# Patient Record
Sex: Female | Born: 1970
Health system: Southern US, Community
[De-identification: ages and names within clinical notes are randomized; demographics above are authoritative.]

## PROBLEM LIST (undated history)

## (undated) DIAGNOSIS — D649 Anemia, unspecified: Secondary | ICD-10-CM

## (undated) DIAGNOSIS — J302 Other seasonal allergic rhinitis: Secondary | ICD-10-CM

## (undated) DIAGNOSIS — J45909 Unspecified asthma, uncomplicated: Secondary | ICD-10-CM

## (undated) HISTORY — DX: Unspecified asthma, uncomplicated: J45.909

## (undated) HISTORY — DX: Anemia, unspecified: D64.9

## (undated) HISTORY — DX: Other seasonal allergic rhinitis: J30.2

---

## 2001-02-26 ENCOUNTER — Ambulatory Visit (HOSPITAL_COMMUNITY): Admission: RE | Admit: 2001-02-26 | Discharge: 2001-02-26 | Payer: Self-pay | Admitting: Obstetrics & Gynecology

## 2001-04-24 ENCOUNTER — Inpatient Hospital Stay (HOSPITAL_COMMUNITY): Admission: AD | Admit: 2001-04-24 | Discharge: 2001-04-24 | Payer: Self-pay | Admitting: *Deleted

## 2001-04-26 ENCOUNTER — Inpatient Hospital Stay (HOSPITAL_COMMUNITY): Admission: AD | Admit: 2001-04-26 | Discharge: 2001-04-26 | Payer: Self-pay | Admitting: Obstetrics

## 2001-04-29 ENCOUNTER — Inpatient Hospital Stay (HOSPITAL_COMMUNITY): Admission: AD | Admit: 2001-04-29 | Discharge: 2001-05-12 | Payer: Self-pay | Admitting: Obstetrics

## 2001-05-01 ENCOUNTER — Encounter: Payer: Self-pay | Admitting: Obstetrics

## 2001-05-13 ENCOUNTER — Encounter: Admission: RE | Admit: 2001-05-13 | Discharge: 2001-06-12 | Payer: Self-pay | Admitting: Obstetrics

## 2001-07-13 ENCOUNTER — Encounter: Admission: RE | Admit: 2001-07-13 | Discharge: 2001-08-12 | Payer: Self-pay | Admitting: Obstetrics

## 2004-08-18 ENCOUNTER — Ambulatory Visit: Payer: Self-pay | Admitting: *Deleted

## 2004-08-23 ENCOUNTER — Ambulatory Visit: Payer: Self-pay | Admitting: Obstetrics and Gynecology

## 2004-08-25 ENCOUNTER — Ambulatory Visit: Payer: Self-pay | Admitting: Family Medicine

## 2004-08-30 ENCOUNTER — Ambulatory Visit (HOSPITAL_COMMUNITY): Admission: RE | Admit: 2004-08-30 | Discharge: 2004-08-30 | Payer: Self-pay | Admitting: *Deleted

## 2004-09-08 ENCOUNTER — Ambulatory Visit: Payer: Self-pay | Admitting: Family Medicine

## 2004-09-22 ENCOUNTER — Ambulatory Visit: Payer: Self-pay | Admitting: Family Medicine

## 2004-10-06 ENCOUNTER — Ambulatory Visit: Payer: Self-pay | Admitting: Family Medicine

## 2004-10-20 ENCOUNTER — Ambulatory Visit: Payer: Self-pay | Admitting: Family Medicine

## 2004-11-03 ENCOUNTER — Ambulatory Visit: Payer: Self-pay | Admitting: Family Medicine

## 2004-11-17 ENCOUNTER — Ambulatory Visit: Payer: Self-pay | Admitting: *Deleted

## 2004-11-24 ENCOUNTER — Ambulatory Visit: Payer: Self-pay | Admitting: Family Medicine

## 2004-12-08 ENCOUNTER — Ambulatory Visit: Payer: Self-pay | Admitting: Family Medicine

## 2004-12-15 ENCOUNTER — Ambulatory Visit: Payer: Self-pay | Admitting: Family Medicine

## 2004-12-22 ENCOUNTER — Ambulatory Visit (HOSPITAL_COMMUNITY): Admission: RE | Admit: 2004-12-22 | Discharge: 2004-12-22 | Payer: Self-pay | Admitting: *Deleted

## 2004-12-22 ENCOUNTER — Ambulatory Visit: Payer: Self-pay | Admitting: *Deleted

## 2004-12-29 ENCOUNTER — Ambulatory Visit: Payer: Self-pay | Admitting: Family Medicine

## 2005-01-05 ENCOUNTER — Ambulatory Visit: Payer: Self-pay | Admitting: Family Medicine

## 2005-01-12 ENCOUNTER — Ambulatory Visit: Payer: Self-pay | Admitting: Family Medicine

## 2005-01-19 ENCOUNTER — Ambulatory Visit: Payer: Self-pay | Admitting: Family Medicine

## 2005-01-26 ENCOUNTER — Ambulatory Visit: Payer: Self-pay | Admitting: Family Medicine

## 2005-01-30 ENCOUNTER — Ambulatory Visit: Payer: Self-pay | Admitting: Obstetrics and Gynecology

## 2005-02-01 ENCOUNTER — Inpatient Hospital Stay (HOSPITAL_COMMUNITY): Admission: AD | Admit: 2005-02-01 | Discharge: 2005-02-03 | Payer: Self-pay | Admitting: *Deleted

## 2005-02-01 ENCOUNTER — Ambulatory Visit: Payer: Self-pay | Admitting: Obstetrics and Gynecology

## 2012-05-30 ENCOUNTER — Other Ambulatory Visit: Payer: Self-pay | Admitting: *Deleted

## 2012-05-30 DIAGNOSIS — N644 Mastodynia: Secondary | ICD-10-CM

## 2012-06-17 ENCOUNTER — Encounter (HOSPITAL_COMMUNITY): Payer: Self-pay | Admitting: *Deleted

## 2012-06-25 ENCOUNTER — Encounter (HOSPITAL_COMMUNITY): Payer: Self-pay

## 2012-06-25 ENCOUNTER — Ambulatory Visit (HOSPITAL_COMMUNITY)
Admission: RE | Admit: 2012-06-25 | Discharge: 2012-06-25 | Disposition: A | Discharge status: Home / Self Care | Payer: Self-pay | Source: Ambulatory Visit | Attending: Obstetrics and Gynecology | Admitting: Obstetrics and Gynecology

## 2012-06-25 ENCOUNTER — Other Ambulatory Visit: Payer: Self-pay | Admitting: Obstetrics and Gynecology

## 2012-06-25 VITALS — BP 120/82 | Temp 98.5°F | Ht 67.5 in | Wt 164.2 lb

## 2012-06-25 DIAGNOSIS — Z01419 Encounter for gynecological examination (general) (routine) without abnormal findings: Secondary | ICD-10-CM

## 2012-06-25 DIAGNOSIS — N644 Mastodynia: Secondary | ICD-10-CM

## 2012-06-25 NOTE — Patient Instructions (Signed)
Taught patient how to perform BSE and gave educational materials to take home. Let her know [facility] will cover Pap smears every 3 years unless has a history of abnormal Pap smears. Patient referred to the [facility] for diagnostic mammogram and possible right breast ultrasound. Appointment scheduled for Wednesday, July 04, 2012 at 1030. Patient aware of appointment and will be there. Let patient know will follow up with her within the next couple weeks with results by letter or phone. Patient verbalized understanding.

## 2012-06-25 NOTE — Progress Notes (Signed)
Patient complained of right breast pain x 3 months that patient rates at a 7-8 out of 10.  Pap Smear:    Pap smear completed today. Per patient last Pap smear was January 2011 at [facility] and normal. Per patient she has no history of an abnormal Pap smear. No Pap smear results are in EPIC.  Physical exam: Breasts Breasts symmetrical. No skin abnormalities bilateral breasts. No nipple retraction bilateral breasts. No nipple discharge bilateral breasts. No lymphadenopathy. No lumps palpated bilateral breasts. Patient complained of right upper outer quadrant breast pain on exam. Patient referred to the [facility] for diagnostic mammogram and possible right breast ultrasound. Appointment scheduled for Wednesday, July 04, 2012 at 1030.          Pelvic/Bimanual   Ext Genitalia No lesions, no swelling and no discharge observed on external genitalia.         Vagina Vagina pink and normal texture. No lesions or discharge observed in vagina.          Cervix Cervix is present. Cervix red and friable. No discharge observed.     Uterus Uterus is present and palpable. Uterus in normal position and normal size.        Adnexae Bilateral ovaries present and palpable. No tenderness on palpation.          Rectovaginal No rectal exam completed today since patient had no rectal complaints. No skin abnormalities observed on exam.

## 2012-07-04 ENCOUNTER — Ambulatory Visit
Admission: RE | Admit: 2012-07-04 | Discharge: 2012-07-04 | Disposition: A | Discharge status: Home / Self Care | Payer: No Typology Code available for payment source | Source: Ambulatory Visit | Attending: Obstetrics and Gynecology | Admitting: Obstetrics and Gynecology

## 2012-07-04 DIAGNOSIS — N644 Mastodynia: Secondary | ICD-10-CM

## 2012-07-29 ENCOUNTER — Telehealth (HOSPITAL_COMMUNITY): Payer: Self-pay | Admitting: *Deleted

## 2012-07-29 NOTE — Telephone Encounter (Signed)
Telephoned patient at mobile # and discussed results of negative pap smear. Next pap smear due in 3 years. Patient voiced understanding.

## 2012-11-04 ENCOUNTER — Encounter (HOSPITAL_COMMUNITY): Payer: Self-pay | Admitting: Emergency Medicine

## 2012-11-04 ENCOUNTER — Emergency Department (HOSPITAL_COMMUNITY)
Admission: EM | Admit: 2012-11-04 | Discharge: 2012-11-04 | Disposition: A | Discharge status: Home / Self Care | Payer: BC Managed Care – PPO | Source: Home / Self Care | Attending: Emergency Medicine | Admitting: Emergency Medicine

## 2012-11-04 DIAGNOSIS — J302 Other seasonal allergic rhinitis: Secondary | ICD-10-CM

## 2012-11-04 DIAGNOSIS — J309 Allergic rhinitis, unspecified: Secondary | ICD-10-CM

## 2012-11-04 DIAGNOSIS — J029 Acute pharyngitis, unspecified: Secondary | ICD-10-CM

## 2012-11-04 DIAGNOSIS — J310 Chronic rhinitis: Secondary | ICD-10-CM

## 2012-11-04 MED ORDER — FLUTICASONE PROPIONATE 50 MCG/ACT NA SUSP: 2.0000 | Freq: Every day | NASAL | Status: DC

## 2012-11-04 MED ORDER — FEXOFENADINE-PSEUDOEPHED ER 60-120 MG PO TB12: 1.0000 | ORAL_TABLET | Freq: Two times a day (BID) | ORAL | Status: DC

## 2012-11-04 NOTE — ED Notes (Signed)
Physician e-scribed scripts to the wrong pharmacy.  Notified physician.  Called pharmacy of patient preference and scripts were transferred to location preferred by patient.

## 2012-11-04 NOTE — ED Provider Notes (Signed)
History     CSN: [id number]  Arrival date & time 11/04/12  1144   First MD Initiated Contact with Patient 11/04/12 1309      No chief complaint on file.   (Consider location/radiation/quality/duration/timing/severity/associated sxs/prior treatment) HPI Comments: Patient presents urgent care describing that for several days almost a week she's been having several symptoms such as a runny and congested nose. A sore throat some body aches and feeling cold at times. Itchiness and irritation and tearing of both of her eyes. Occasional sneezing. No cough or shortness of breath or wheezing. She does however have some body aches and feels somewhat fatigue. Feels sometimes that she "chokes", it was she's able to bring up phlegm, he gets stuck or adhere to her throat. She has taken some over-the-counter cetirizine, a couple days ago but it did not help  Patient is a 42 y.o. female presenting with pharyngitis.  Sore Throat This is a new problem. The current episode started more than 2 days ago. The problem occurs constantly. The problem has been gradually worsening. Pertinent negatives include no abdominal pain and no shortness of breath. The symptoms are aggravated by swallowing. Nothing relieves the symptoms.    No past medical history on file.  No past surgical history on file.  Family History  Problem Relation Age of Onset  . Cancer Mother     uterine    History  Substance Use Topics  . Smoking status: Never Smoker   . Smokeless tobacco: Not on file  . Alcohol Use: No    OB History   Grav Para Term Preterm Abortions TAB SAB Ect Mult Living   2 2 2       2       Review of Systems  Constitutional: Negative for chills, diaphoresis, activity change, appetite change and unexpected weight change.  HENT: Positive for congestion, sore throat, rhinorrhea and sinus pressure. Negative for ear pain, neck pain and dental problem.   Eyes: Positive for redness and itching. Negative for  photophobia, pain, discharge and visual disturbance.  Respiratory: Negative for cough, shortness of breath and wheezing.   Gastrointestinal: Negative for abdominal pain.  Musculoskeletal: Positive for myalgias and arthralgias. Negative for back pain, joint swelling and gait problem.  Skin: Negative for color change.  Allergic/Immunologic: Positive for environmental allergies. Negative for immunocompromised state.    Allergies  Review of patient's allergies indicates no known allergies.  Home Medications   Current Outpatient Rx  Name  Route  Sig  Dispense  Refill  . fexofenadine-pseudoephedrine (ALLEGRA-D) 60-120 MG per tablet   Oral   Take 1 tablet by mouth every 12 (twelve) hours.   30 tablet   0   . fluticasone (FLONASE) 50 MCG/ACT nasal spray   Nasal   Place 2 sprays into the nose daily.   16 g   2     BP 118/77  Pulse 77  Temp(Src) 98.3 F (36.8 C) (Oral)  Resp 16  SpO2 100%  Physical Exam  Nursing note and vitals reviewed. Constitutional: She is oriented to person, place, and time. Vital signs are normal. She appears well-developed and well-nourished.  Non-toxic appearance. She does not have a sickly appearance. She does not appear ill. No distress.  HENT:  Head: Normocephalic.  Right Ear: Tympanic membrane normal.  Left Ear: Tympanic membrane normal.  Nose: Rhinorrhea present. No nasal septal hematoma. No epistaxis.  No foreign bodies.  Mouth/Throat: Uvula is midline and mucous membranes are normal. No oropharyngeal exudate, posterior  oropharyngeal edema or tonsillar abscesses.  Eyes: Conjunctivae are normal. Pupils are equal, round, and reactive to light. Right eye exhibits no discharge. Left eye exhibits no discharge. No scleral icterus.  Neck: Neck supple. No JVD present.  Cardiovascular: Normal rate.  Exam reveals no gallop and no friction rub.   No murmur heard. Pulmonary/Chest: Effort normal and breath sounds normal.  Lymphadenopathy:    She has no  cervical adenopathy.  Neurological: She is alert and oriented to person, place, and time.  Skin: No rash noted. No erythema.    ED Course  Procedures (including critical care time)  Labs Reviewed - No data to display No results found.   1. Seasonal allergies   2. Pharyngitis   3. Rhinitis       MDM  Current symptoms and exam are most consistent with seasonal allergies induced symptoms. Will start patient with a two-week course of an antihistamine with a decongestion as well as Flonase. Patient is afebrile the most comfortable in no respiratory distress and no significant findings on her physical exam.      Jimmie Molly, MD 11/04/12 1349

## 2013-05-25 ENCOUNTER — Encounter (HOSPITAL_COMMUNITY): Payer: Self-pay | Admitting: Emergency Medicine

## 2013-05-25 ENCOUNTER — Emergency Department (INDEPENDENT_AMBULATORY_CARE_PROVIDER_SITE_OTHER)
Admission: EM | Admit: 2013-05-25 | Discharge: 2013-05-25 | Disposition: A | Discharge status: Home / Self Care | Payer: BC Managed Care – PPO | Source: Home / Self Care | Attending: Family Medicine | Admitting: Family Medicine

## 2013-05-25 DIAGNOSIS — R1033 Periumbilical pain: Secondary | ICD-10-CM

## 2013-05-25 DIAGNOSIS — R52 Pain, unspecified: Secondary | ICD-10-CM

## 2013-05-25 NOTE — ED Provider Notes (Addendum)
CSN: [id number]     Arrival date & time 05/25/13  1708 History   First MD Initiated Contact with Patient 05/25/13 1711     Chief Complaint  Patient presents with  . Abdominal Pain   (Consider location/radiation/quality/duration/timing/severity/associated sxs/prior Treatment) Patient is a 42 y.o. female presenting with abdominal pain. The history is provided by the patient.  Abdominal Pain This is a new problem. The current episode started yesterday. The problem has been gradually improving. Associated symptoms include abdominal pain. Pertinent negatives include no chest pain and no headaches.    History reviewed. No pertinent past medical history. History reviewed. No pertinent past surgical history. Family History  Problem Relation Age of Onset  . Cancer Mother     uterine   History  Substance Use Topics  . Smoking status: Never Smoker   . Smokeless tobacco: Not on file  . Alcohol Use: No   OB History   Grav Para Term Preterm Abortions TAB SAB Ect Mult Living   2 2 2       2      Review of Systems  Constitutional: Negative.   Cardiovascular: Negative for chest pain.  Gastrointestinal: Positive for abdominal pain. Negative for nausea, vomiting, diarrhea, constipation and abdominal distention.  Genitourinary: Negative.   Neurological: Negative for headaches.    Allergies  Review of patient's allergies indicates no known allergies.  Home Medications   Current Outpatient Rx  Name  Route  Sig  Dispense  Refill  . cetirizine (ZYRTEC) 10 MG tablet   Oral   Take 10 mg by mouth daily.         . fexofenadine-pseudoephedrine (ALLEGRA-D) 60-120 MG per tablet   Oral   Take 1 tablet by mouth every 12 (twelve) hours.   30 tablet   0   . fluticasone (FLONASE) 50 MCG/ACT nasal spray   Nasal   Place 2 sprays into the nose daily.   16 g   2   . Naproxen Sodium (ALEVE PO)   Oral   Take by mouth.          BP 119/78  Pulse 90  Temp(Src) 98 F (36.7 C) (Oral)   Resp 16  SpO2 98%  LMP 05/11/2013 Physical Exam  Nursing note and vitals reviewed. Constitutional: She appears well-developed and well-nourished. No distress.  Abdominal: Soft. Bowel sounds are normal. She exhibits no distension and no mass. There is no hepatosplenomegaly. There is tenderness in the periumbilical area. There is no rigidity, no rebound, no guarding, no CVA tenderness, no tenderness at McBurney's point and negative Murphy's sign. A hernia is present. Hernia confirmed positive in the ventral area.    Skin: Skin is warm and dry.    ED Course  Procedures (including critical care time) Labs Review Labs Reviewed - No data to display Imaging Review No results found.  EKG Interpretation     Ventricular Rate:    PR Interval:    QRS Duration:   QT Interval:    QTC Calculation:   R Axis:     Text Interpretation:              MDM      Linna Hoff, MD 05/25/13 1742  Linna Hoff, MD 05/25/13 [phone]

## 2013-05-25 NOTE — ED Notes (Signed)
Pt c/o abdominal pain x 1 day. Was told she has a hernia and never had any surgical intervention. Felt mild cramping and was doubled over in pain. Pt als o c/o HA on right side. HA subsided after taking advil but returned this morning. Pt is alert and oriented and in no acute distress.

## 2013-05-28 ENCOUNTER — Encounter (HOSPITAL_COMMUNITY): Payer: Self-pay | Admitting: Emergency Medicine

## 2013-05-28 DIAGNOSIS — D509 Iron deficiency anemia, unspecified: Secondary | ICD-10-CM | POA: Insufficient documentation

## 2013-05-28 DIAGNOSIS — K429 Umbilical hernia without obstruction or gangrene: Secondary | ICD-10-CM | POA: Insufficient documentation

## 2013-05-28 DIAGNOSIS — R1013 Epigastric pain: Secondary | ICD-10-CM | POA: Insufficient documentation

## 2013-05-28 DIAGNOSIS — IMO0002 Reserved for concepts with insufficient information to code with codable children: Secondary | ICD-10-CM | POA: Insufficient documentation

## 2013-05-28 DIAGNOSIS — Z79899 Other long term (current) drug therapy: Secondary | ICD-10-CM | POA: Insufficient documentation

## 2013-05-28 DIAGNOSIS — Z3202 Encounter for pregnancy test, result negative: Secondary | ICD-10-CM | POA: Insufficient documentation

## 2013-05-28 LAB — COMPREHENSIVE METABOLIC PANEL
ALT: 9 U/L (ref 0–35)
Albumin: 3.7 g/dL (ref 3.5–5.2)
Alkaline Phosphatase: 71 U/L (ref 39–117)
BUN: 13 mg/dL (ref 6–23)
Chloride: 103 mEq/L (ref 96–112)
Potassium: 3.7 mEq/L (ref 3.5–5.1)
Sodium: 136 mEq/L (ref 135–145)
Total Bilirubin: 0.3 mg/dL (ref 0.3–1.2)

## 2013-05-28 LAB — CBC WITH DIFFERENTIAL/PLATELET
Eosinophils Relative: 3 % (ref 0–5)
HCT: 27.8 % — ABNORMAL LOW (ref 36.0–46.0)
Lymphocytes Relative: 38 % (ref 12–46)
Lymphs Abs: 2.8 10*3/uL (ref 0.7–4.0)
MCV: 61.4 fL — ABNORMAL LOW (ref 78.0–100.0)
Monocytes Relative: 12 % (ref 3–12)
Neutro Abs: 3.4 10*3/uL (ref 1.7–7.7)
Platelets: 208 10*3/uL (ref 150–400)
RBC: 4.53 MIL/uL (ref 3.87–5.11)
WBC: 7.4 10*3/uL (ref 4.0–10.5)

## 2013-05-28 LAB — POCT I-STAT TROPONIN I

## 2013-05-28 LAB — LIPASE, BLOOD: Lipase: 61 U/L — ABNORMAL HIGH (ref 11–59)

## 2013-05-28 NOTE — ED Notes (Signed)
Pt. reports epigastric pain onset this evening , denies nausea/vomitting or diarrhea , no SOB .

## 2013-05-29 ENCOUNTER — Emergency Department (HOSPITAL_COMMUNITY)
Admission: EM | Admit: 2013-05-29 | Discharge: 2013-05-29 | Disposition: A | Discharge status: Home / Self Care | Payer: BC Managed Care – PPO | Attending: Emergency Medicine | Admitting: Emergency Medicine

## 2013-05-29 DIAGNOSIS — R1013 Epigastric pain: Secondary | ICD-10-CM

## 2013-05-29 DIAGNOSIS — K429 Umbilical hernia without obstruction or gangrene: Secondary | ICD-10-CM

## 2013-05-29 DIAGNOSIS — D509 Iron deficiency anemia, unspecified: Secondary | ICD-10-CM

## 2013-05-29 LAB — URINALYSIS, ROUTINE W REFLEX MICROSCOPIC
Bilirubin Urine: NEGATIVE
Glucose, UA: NEGATIVE mg/dL
Hgb urine dipstick: NEGATIVE
Ketones, ur: NEGATIVE mg/dL
Nitrite: NEGATIVE
Specific Gravity, Urine: 1.014 (ref 1.005–1.030)
pH: 6.5 (ref 5.0–8.0)

## 2013-05-29 LAB — POCT PREGNANCY, URINE: Preg Test, Ur: NEGATIVE

## 2013-05-29 MED ORDER — GI COCKTAIL ~~LOC~~
30.0000 mL | Freq: Once | ORAL | Status: AC
  Administered 2013-05-29: 30 mL via ORAL
  Filled 2013-05-29: qty 30

## 2013-05-29 MED ORDER — PANTOPRAZOLE SODIUM 40 MG PO TBEC
40.0000 mg | DELAYED_RELEASE_TABLET | Freq: Once | ORAL | Status: AC
  Administered 2013-05-29: 40 mg via ORAL
  Filled 2013-05-29: qty 1

## 2013-05-29 MED ORDER — FERROUS SULFATE 325 (65 FE) MG PO TABS: 325.0000 mg | ORAL_TABLET | Freq: Two times a day (BID) | ORAL | Status: DC

## 2013-05-29 MED ORDER — PANTOPRAZOLE SODIUM 40 MG PO TBEC: 40.0000 mg | DELAYED_RELEASE_TABLET | Freq: Every day | ORAL | Status: DC

## 2013-05-29 NOTE — ED Notes (Signed)
Pt c/o pain around naval, Pt was at urgent care on Sunday, 9/10

## 2013-05-29 NOTE — ED Provider Notes (Signed)
CSN: [id number]     Arrival date & time 05/28/13  2227 History   First MD Initiated Contact with Patient 05/29/13 0026     Chief Complaint  Patient presents with  . Chest Pain   (Consider location/radiation/quality/duration/timing/severity/associated sxs/prior Treatment) Patient is a 42 y.o. female presenting with chest pain. The history is provided by the patient.  Chest Pain She had onset this evening of epigastric pain. Pain is sharp and waxes and wanes. It is rated at 8/10 at its worst. There is no associated nausea or vomiting and no constipation or diarrhea. She states that she occasionally uses naproxen but estimates that she only uses it once a month. No other NSAID or aspirin use. Denies ethanol use. She had been seen in urgent care several days ago for an umbilical hernia but states that this pain is different. It is worse with palpation but it is not affected by eating. She tried drinking some tea which did not give her any relief.  History reviewed. No pertinent past medical history. History reviewed. No pertinent past surgical history. Family History  Problem Relation Age of Onset  . Cancer Mother     uterine   History  Substance Use Topics  . Smoking status: Never Smoker   . Smokeless tobacco: Not on file  . Alcohol Use: No   OB History   Grav Para Term Preterm Abortions TAB SAB Ect Mult Living   2 2 2       2      Review of Systems  Cardiovascular: Positive for chest pain.  All other systems reviewed and are negative.    Allergies  Review of patient's allergies indicates no known allergies.  Home Medications   Current Outpatient Rx  Name  Route  Sig  Dispense  Refill  . cetirizine (ZYRTEC) 10 MG tablet   Oral   Take 10 mg by mouth daily.         . fexofenadine-pseudoephedrine (ALLEGRA-D) 60-120 MG per tablet   Oral   Take 1 tablet by mouth every 12 (twelve) hours.   30 tablet   0   . fluticasone (FLONASE) 50 MCG/ACT nasal spray   Nasal   Place 2  sprays into the nose daily.   16 g   2   . Naproxen Sodium (ALEVE PO)   Oral   Take by mouth.          BP 133/92  Pulse 97  Temp(Src) 98.3 F (36.8 C) (Oral)  Resp 18  SpO2 98%  LMP 05/11/2013 Physical Exam  Nursing note and vitals reviewed.  42 year old female, resting comfortably and in no acute distress. Vital signs are significant for borderline hypertension with blood pressure 133/92. Oxygen saturation is 98%, which is normal. Head is normocephalic and atraumatic. PERRLA, EOMI. Oropharynx is clear. Neck is nontender and supple without adenopathy or JVD. Back is nontender and there is no CVA tenderness. Lungs are clear without rales, wheezes, or rhonchi. Chest is nontender. Heart has regular rate and rhythm without murmur. Abdomen is soft, flat, with mild epigastric tenderness. There is no rebound or guarding. Umbilical hernia is present which is easily reducible and nontender. There are no masses or hepatosplenomegaly and peristalsis is normoactive. Extremities have no cyanosis or edema, full range of motion is present. Skin is warm and dry without rash. Neurologic: Mental status is normal, cranial nerves are intact, there are no motor or sensory deficits.  ED Course  Procedures (including critical care time) Labs  Review Results for orders placed during the hospital encounter of 05/29/13  CBC WITH DIFFERENTIAL      Result Value Range   WBC 7.4  4.0 - 10.5 K/uL   RBC 4.53  3.87 - 5.11 MIL/uL   Hemoglobin 8.3 (*) 12.0 - 15.0 g/dL   HCT 16.1 (*) 09.6 - 04.5 %   MCV 61.4 (*) 78.0 - 100.0 fL   MCH 18.3 (*) 26.0 - 34.0 pg   MCHC 29.9 (*) 30.0 - 36.0 g/dL   RDW 40.9 (*) 81.1 - 91.4 %   Platelets 208  150 - 400 K/uL   Neutrophils Relative % 46  43 - 77 %   Lymphocytes Relative 38  12 - 46 %   Monocytes Relative 12  3 - 12 %   Eosinophils Relative 3  0 - 5 %   Basophils Relative 1  0 - 1 %   Neutro Abs 3.4  1.7 - 7.7 K/uL   Lymphs Abs 2.8  0.7 - 4.0 K/uL   Monocytes  Absolute 0.9  0.1 - 1.0 K/uL   Eosinophils Absolute 0.2  0.0 - 0.7 K/uL   Basophils Absolute 0.1  0.0 - 0.1 K/uL   RBC Morphology SPHEROCYTES     WBC Morphology ATYPICAL LYMPHOCYTES    COMPREHENSIVE METABOLIC PANEL      Result Value Range   Sodium 136  135 - 145 mEq/L   Potassium 3.7  3.5 - 5.1 mEq/L   Chloride 103  96 - 112 mEq/L   CO2 22  19 - 32 mEq/L   Glucose, Bld 114 (*) 70 - 99 mg/dL   BUN 13  6 - 23 mg/dL   Creatinine, Ser 7.82  0.50 - 1.10 mg/dL   Calcium 9.5  8.4 - 95.6 mg/dL   Total Protein 7.6  6.0 - 8.3 g/dL   Albumin 3.7  3.5 - 5.2 g/dL   AST 18  0 - 37 U/L   ALT 9  0 - 35 U/L   Alkaline Phosphatase 71  39 - 117 U/L   Total Bilirubin 0.3  0.3 - 1.2 mg/dL   GFR calc non Af Amer >90  >90 mL/min   GFR calc Af Amer >90  >90 mL/min  LIPASE, BLOOD      Result Value Range   Lipase 61 (*) 11 - 59 U/L  URINALYSIS, ROUTINE W REFLEX MICROSCOPIC      Result Value Range   Color, Urine YELLOW  YELLOW   APPearance CLEAR  CLEAR   Specific Gravity, Urine 1.014  1.005 - 1.030   pH 6.5  5.0 - 8.0   Glucose, UA NEGATIVE  NEGATIVE mg/dL   Hgb urine dipstick NEGATIVE  NEGATIVE   Bilirubin Urine NEGATIVE  NEGATIVE   Ketones, ur NEGATIVE  NEGATIVE mg/dL   Protein, ur NEGATIVE  NEGATIVE mg/dL   Urobilinogen, UA 0.2  0.0 - 1.0 mg/dL   Nitrite NEGATIVE  NEGATIVE   Leukocytes, UA NEGATIVE  NEGATIVE  POCT I-STAT TROPONIN I      Result Value Range   Troponin i, poc 0.00  0.00 - 0.08 ng/mL   Comment 3           POCT PREGNANCY, URINE      Result Value Range   Preg Test, Ur NEGATIVE  NEGATIVE   MDM   1. Epigastric pain   2. Microcytic anemia   3. Umbilical hernia    Epigastric pain which seems most consistent with gastritis. She'll be  given a therapeutic trial of a GI cocktail. Her laboratory workup had been completed and she has a borderline elevated lipase which is not felt to be clinically significant. Significant anemia is present which is microcytic with elevated RDW  suggesting iron deficiency. Old records are reviewed and she was seen several days ago for umbilical hernia.  She feels much better after GI cocktail. She'll be treated for possible peptic ulcer disease, possible gastritis with pantoprazole. She is given a prescription for ferrous sulfate for her anemia. Of note, she does state that she has very heavy menses which is the most likely source of blood loss.  Dione Booze, MD 05/29/13 [phone]

## 2013-06-16 ENCOUNTER — Other Ambulatory Visit: Payer: Self-pay

## 2013-07-25 ENCOUNTER — Encounter: Payer: Self-pay | Admitting: Women's Health

## 2013-07-25 ENCOUNTER — Ambulatory Visit (INDEPENDENT_AMBULATORY_CARE_PROVIDER_SITE_OTHER): Payer: BC Managed Care – PPO | Admitting: Women's Health

## 2013-07-25 VITALS — BP 109/72 | Ht 68.0 in | Wt 156.2 lb

## 2013-07-25 DIAGNOSIS — Z01419 Encounter for gynecological examination (general) (routine) without abnormal findings: Secondary | ICD-10-CM

## 2013-07-25 DIAGNOSIS — Z833 Family history of diabetes mellitus: Secondary | ICD-10-CM

## 2013-07-25 LAB — CBC WITH DIFFERENTIAL/PLATELET
BASOS ABS: 0 10*3/uL (ref 0.0–0.1)
Basophils Relative: 1 % (ref 0–1)
EOS ABS: 0.1 10*3/uL (ref 0.0–0.7)
EOS PCT: 1 % (ref 0–5)
HCT: 38.5 % (ref 36.0–46.0)
Hemoglobin: 12.1 g/dL (ref 12.0–15.0)
LYMPHS ABS: 2.2 10*3/uL (ref 0.7–4.0)
Lymphocytes Relative: 37 % (ref 12–46)
MCH: 22.3 pg — AB (ref 26.0–34.0)
MCHC: 31.4 g/dL (ref 30.0–36.0)
MCV: 70.9 fL — AB (ref 78.0–100.0)
Monocytes Absolute: 0.7 10*3/uL (ref 0.1–1.0)
Monocytes Relative: 13 % — ABNORMAL HIGH (ref 3–12)
Neutro Abs: 2.8 10*3/uL (ref 1.7–7.7)
Neutrophils Relative %: 48 % (ref 43–77)
PLATELETS: 234 10*3/uL (ref 150–400)
RBC: 5.43 MIL/uL — ABNORMAL HIGH (ref 3.87–5.11)
RDW: 20.9 % — AB (ref 11.5–15.5)
WBC: 5.8 10*3/uL (ref 4.0–10.5)

## 2013-07-25 LAB — HEMOGLOBIN A1C
HEMOGLOBIN A1C: 5.2 % (ref <5.7)
Mean Plasma Glucose: 103 mg/dL (ref <117)

## 2013-07-25 NOTE — Progress Notes (Signed)
Brenda Reed Aug 18, 1970 [id number]    History:    Presents for annual exam.  monthy cycle using no contraception, pregnancy ok.  Normal pap and mammogram history.  Mastodynia.   Past medical history, past surgical history, family history and social history were all reviewed and documented in the EPIC chart. From [place].  Works at [employer], sons 53 and 51. Mother uterine cancer.  ROS:  A  ROS was performed and pertinent positives and negatives are included.  Exam:  Filed Vitals:   07/25/13 1028  BP: 109/72    General appearance:  Normal Thyroid:  Symmetrical, normal in size, without palpable masses or nodularity. Respiratory  Auscultation:  Clear without wheezing or rhonchi Cardiovascular  Auscultation:  Regular rate, without rubs, murmurs or gallops  Edema/varicosities:  Not grossly evident Abdominal  Soft,nontender, without masses, guarding or rebound.  Liver/spleen:  No organomegaly noted  Hernia:  None appreciated  Skin  Inspection:  Grossly normal   Breasts: Examined lying and sitting.     Right: Without masses, retractions, discharge or axillary adenopathy.     Left: Without masses, retractions, discharge or axillary adenopathy. Gentitourinary   Inguinal/mons:  Normal without inguinal adenopathy  External genitalia:  Normal  BUS/Urethra/Skene's glands:  Normal  Vagina:  Normal  Cervix:  Normal  Uterus:   normal in size, shape and contour.  Midline and mobile  Adnexa/parametria:     Rt: Without masses or tenderness.   Lt: Without masses or tenderness.  Anus and perineum: Normal  Digital rectal exam: Normal sphincter tone without palpated masses or tenderness  Assessment/Plan:  43 y.o. MBF G2P2 for annual exam with no complaints.  Normal Gyn exam/declines contraception Mastodynia  Plan: SBE's.  annual mammogram 3D tomography reviewed and encouraged, history of dense breast. Pap normal 2013. New screening guidelines reviewed. Exercise, MVI daily, Vit  E, calcium rich diet encouraged. CBC, hgb A1c, UA. Return to office with missed cycle.   [name] [facility], 11:49 AM 07/25/2013

## 2013-07-25 NOTE — Patient Instructions (Signed)

## 2013-07-26 LAB — URINALYSIS W MICROSCOPIC + REFLEX CULTURE
BACTERIA UA: NONE SEEN
Bilirubin Urine: NEGATIVE
CASTS: NONE SEEN
Crystals: NONE SEEN
Glucose, UA: NEGATIVE mg/dL
HGB URINE DIPSTICK: NEGATIVE
KETONES UR: NEGATIVE mg/dL
Leukocytes, UA: NEGATIVE
NITRITE: NEGATIVE
PH: 5.5 (ref 5.0–8.0)
Protein, ur: NEGATIVE mg/dL
Specific Gravity, Urine: 1.029 (ref 1.005–1.030)
Urobilinogen, UA: 0.2 mg/dL (ref 0.0–1.0)

## 2013-07-28 ENCOUNTER — Other Ambulatory Visit: Payer: Self-pay

## 2013-07-28 DIAGNOSIS — Z1231 Encounter for screening mammogram for malignant neoplasm of breast: Secondary | ICD-10-CM

## 2013-08-19 ENCOUNTER — Ambulatory Visit
Admission: RE | Admit: 2013-08-19 | Discharge: 2013-08-19 | Disposition: A | Discharge status: Home / Self Care | Payer: BC Managed Care – PPO | Source: Ambulatory Visit

## 2013-08-19 DIAGNOSIS — Z1231 Encounter for screening mammogram for malignant neoplasm of breast: Secondary | ICD-10-CM

## 2014-05-11 ENCOUNTER — Encounter: Payer: Self-pay | Admitting: Women's Health

## 2014-06-08 ENCOUNTER — Ambulatory Visit (INDEPENDENT_AMBULATORY_CARE_PROVIDER_SITE_OTHER): Payer: BC Managed Care – PPO | Admitting: Internal Medicine

## 2014-06-08 ENCOUNTER — Encounter: Payer: Self-pay | Admitting: Internal Medicine

## 2014-06-08 VITALS — BP 108/80 | HR 80 | Temp 98.9°F | Wt 162.2 lb

## 2014-06-08 DIAGNOSIS — R7989 Other specified abnormal findings of blood chemistry: Secondary | ICD-10-CM

## 2014-06-08 DIAGNOSIS — R946 Abnormal results of thyroid function studies: Secondary | ICD-10-CM

## 2014-06-08 LAB — TSH: TSH: 0.02 u[IU]/mL — ABNORMAL LOW (ref 0.35–4.50)

## 2014-06-08 LAB — T3, FREE: T3, Free: 4.2 pg/mL (ref 2.3–4.2)

## 2014-06-08 LAB — T4, FREE: FREE T4: 1.37 ng/dL (ref 0.60–1.60)

## 2014-06-08 NOTE — Progress Notes (Signed)
Patient ID: Brenda Reed, female   DOB: 13-Oct-1970, 43 y.o.   MRN: [id number]   HPI  Brenda Reed is a 43 y.o.-year-old female, referred by her PCP, Dr. Yaakov Guthrie, in consultation for a low TSH.  I reviewed pt's thyroid tests: 04/25/2014: TSH 0.009, total T4 11.5 (4.5-12) No previous checks.  Pt denies feeling nodules in neck, hoarseness, dysphagia/odynophagia, SOB with lying down; she c/o: - no excessive sweating/heat intolerance, just face feeling hotter - + fatigue - no tremors - no anxiety - no palpitations - no problems with concentration - no hyperdefecation - no weight loss  She has regular menses.  ? FH of thyroid ds. No FH of thyroid cancer. No h/o radiation tx to head or neck.  No seaweed or kelp, no recent contrast studies. No steroid use. No herbal supplements.   ROS: Constitutional: see HPI Eyes: no blurry vision, no xerophthalmia ENT: no sore throat, no nodules palpated in throat, no dysphagia/odynophagia, no hoarseness Cardiovascular: no CP/SOB/palpitations/leg swelling Respiratory: no cough/SOB Gastrointestinal: no N/V/D/+ C/+ acid reflux, + bloating Musculoskeletal: + muscle aches/no joint aches Skin: no rashes Neurological: no tremors/numbness/tingling/dizziness Psychiatric: no depression/anxiety Low libido   Past Medical History  Diagnosis Date  . Anemia    No past surgical history on file. History   Social History  . Marital Status: Married    Spouse Name: N/A    Number of Children: 2   Occupational History  . n/a   Social History Main Topics  . Smoking status: Never Smoker   . Smokeless tobacco: No  . Alcohol Use: No  . Drug Use: No   Current Outpatient Prescriptions on File Prior to Visit  Medication Sig Dispense Refill  . ferrous sulfate 325 (65 FE) MG tablet Take 1 tablet (325 mg total) by mouth 2 (two) times daily with a meal. 60 tablet 0  . pantoprazole (PROTONIX) 40 MG tablet Take 1 tablet (40 mg  total) by mouth daily. 30 tablet 0   No current facility-administered medications on file prior to visit.   No Known Allergies Family History  Problem Relation Age of Onset  . Cancer Mother     uterine   PE: BP 108/80 mmHg  Pulse 80  Temp(Src) 98.9 F (37.2 C) (Oral)  Wt 162 lb 3.2 oz (73.573 kg)  SpO2 97% Wt Readings from Last 3 Encounters:  06/08/14 162 lb 3.2 oz (73.573 kg)  07/25/13 156 lb 3.2 oz (70.852 kg)  06/25/12 164 lb 3.2 oz (74.481 kg)   Constitutional: overweight, in NAD Eyes: PERRLA, EOMI, no exophthalmos, no lid lag, no stare ENT: moist mucous membranes, no thyromegaly, no thyroid bruits, no cervical lymphadenopathy Cardiovascular: RRR, No MRG Respiratory: CTA B Gastrointestinal: abdomen soft, NT, ND, BS+ Musculoskeletal: no deformities, strength intact in all 4 Skin: moist, warm, no rashes Neurological: no tremor with outstretched hands, DTR normal in all 4  ASSESSMENT: 1. Low TSH  PLAN:  1. Patient with a recently found low TSH, without thyrotoxic sxs - she does not appear to have exogenous causes for the low TSH.  - We discussed that possible causes of thyrotoxicosis are:  Graves ds   Thyroiditis toxic multinodular goiter/ toxic adenoma (I cannot feel nodules at palpation of her thyroid). - I suggested that we check the TSH, fT3 and fT4 and also had thyroid stimulating antibodies to screen for Graves' disease.  - If the tests remain abnormal, I may need to obtain an uptake and scan to differentiate between the  3 above possible etiologies  - I did advice her that we might need to do thyroid ultrasound depending on the results of the uptake and scan (if a cold nodule is present) - I do not feel that we need to add beta blockers at this time, since she is not tachycardic, anxious, or tremulous - RTC in 4 months, but likely sooner for repeat labs  Component     Latest Ref Rng 06/08/2014  TSH     0.35 - 4.50 uIU/mL 0.02 (L)  Free T4     0.60 - 1.60  ng/dL 1.37  T3, Free     2.3 - 4.2 pg/mL 4.2  TSI     <140 % baseline 185 (H)   TSH improved. Subclinical hyperthyroidism, possibly mild Graves ds, however, the TSI can be mildly elevated in subacute thyroiditis, too. Would like to repeat labs in 6 weeks.

## 2014-06-08 NOTE — Progress Notes (Signed)
Pre visit review using our clinic review tool, if applicable. No additional management support is needed unless otherwise documented below in the visit note. 

## 2014-06-08 NOTE — Patient Instructions (Signed)
Please stop at the lab.  Please come back for a follow-up appointment in 4 months.  Please let me know if you develop the following symptoms:  Hyperthyroidism The thyroid is a large gland located in the lower front part of your neck. The thyroid helps control metabolism. Metabolism is how your body uses food. It controls metabolism with the hormone thyroxine. When the thyroid is overactive, it produces too much hormone. When this happens, these following problems may occur:   Nervousness  Heat intolerance  Weight loss (in spite of increase food intake)  Diarrhea  Change in hair or skin texture  Palpitations (heart skipping or having extra beats)  Tachycardia (rapid heart rate)  Loss of menstruation (amenorrhea)  Shaking of the hands CAUSES  Grave's Disease (the immune system attacks the thyroid gland). This is the most common cause.  Inflammation of the thyroid gland.  Tumor (usually benign) in the thyroid gland or elsewhere.  Excessive use of thyroid medications (both prescription and 'natural').  Excessive ingestion of Iodine. DIAGNOSIS  To prove hyperthyroidism, your caregiver may do blood tests and ultrasound tests. Sometimes the signs are hidden. It may be necessary for your caregiver to watch this illness with blood tests, either before or after diagnosis and treatment. TREATMENT Short-term treatment There are several treatments to control symptoms. Drugs called beta blockers may give some relief. Drugs that decrease hormone production will provide temporary relief in many people. These measures will usually not give permanent relief. Definitive therapy There are treatments available which can be discussed between you and your caregiver which will permanently treat the problem. These treatments range from surgery (removal of the thyroid), to the use of radioactive iodine (destroys the thyroid by radiation), to the use of antithyroid drugs (interfere with hormone  synthesis). The first two treatments are permanent and usually successful. They most often require hormone replacement therapy for life. This is because it is impossible to remove or destroy the exact amount of thyroid required to make a person euthyroid (normal). HOME CARE INSTRUCTIONS  See your caregiver if the problems you are being treated for get worse. Examples of this would be the problems listed above. SEEK MEDICAL CARE IF: Your general condition worsens. MAKE SURE YOU:   Understand these instructions.  Will watch your condition.  Will get help right away if you are not doing well or get worse. Document Released: 06/26/2005 Document Revised: 09/18/2011 Document Reviewed: 11/07/2006 [employer] Patient Information 2015 [employer]. This information is not intended to replace advice given to you by your health care provider. Make sure you discuss any questions you have with your health care provider.  

## 2014-06-11 LAB — THYROID STIMULATING IMMUNOGLOBULIN: TSI: 185 % baseline — ABNORMAL HIGH (ref <140)

## 2014-06-12 DIAGNOSIS — E0590 Thyrotoxicosis, unspecified without thyrotoxic crisis or storm: Secondary | ICD-10-CM | POA: Insufficient documentation

## 2014-06-25 ENCOUNTER — Other Ambulatory Visit (INDEPENDENT_AMBULATORY_CARE_PROVIDER_SITE_OTHER): Payer: BC Managed Care – PPO

## 2014-06-25 ENCOUNTER — Encounter: Payer: Self-pay | Admitting: Internal Medicine

## 2014-06-25 ENCOUNTER — Ambulatory Visit (INDEPENDENT_AMBULATORY_CARE_PROVIDER_SITE_OTHER)
Admission: RE | Admit: 2014-06-25 | Discharge: 2014-06-25 | Disposition: A | Discharge status: Home / Self Care | Payer: BC Managed Care – PPO | Source: Ambulatory Visit | Attending: Internal Medicine | Admitting: Internal Medicine

## 2014-06-25 ENCOUNTER — Ambulatory Visit (INDEPENDENT_AMBULATORY_CARE_PROVIDER_SITE_OTHER): Payer: BC Managed Care – PPO | Admitting: Internal Medicine

## 2014-06-25 VITALS — BP 112/80 | HR 80 | Temp 98.7°F | Ht 66.0 in | Wt 165.0 lb

## 2014-06-25 DIAGNOSIS — R05 Cough: Secondary | ICD-10-CM

## 2014-06-25 DIAGNOSIS — R059 Cough, unspecified: Secondary | ICD-10-CM

## 2014-06-25 DIAGNOSIS — D509 Iron deficiency anemia, unspecified: Secondary | ICD-10-CM

## 2014-06-25 LAB — CBC WITH DIFFERENTIAL/PLATELET
BASOS ABS: 0 10*3/uL (ref 0.0–0.1)
Basophils Relative: 0.6 % (ref 0.0–3.0)
EOS ABS: 0.2 10*3/uL (ref 0.0–0.7)
Eosinophils Relative: 3.7 % (ref 0.0–5.0)
HEMATOCRIT: 35.6 % — AB (ref 36.0–46.0)
HEMOGLOBIN: 11.3 g/dL — AB (ref 12.0–15.0)
Lymphocytes Relative: 40.5 % (ref 12.0–46.0)
Lymphs Abs: 2.1 10*3/uL (ref 0.7–4.0)
MCHC: 31.7 g/dL (ref 30.0–36.0)
MCV: 70.3 fl — ABNORMAL LOW (ref 78.0–100.0)
Monocytes Absolute: 0.7 10*3/uL (ref 0.1–1.0)
Monocytes Relative: 14.1 % — ABNORMAL HIGH (ref 3.0–12.0)
NEUTROS ABS: 2.1 10*3/uL (ref 1.4–7.7)
Neutrophils Relative %: 41.1 % — ABNORMAL LOW (ref 43.0–77.0)
Platelets: 210 10*3/uL (ref 150.0–400.0)
RBC: 5.07 Mil/uL (ref 3.87–5.11)
RDW: 16.2 % — AB (ref 11.5–15.5)
WBC: 5.1 10*3/uL (ref 4.0–10.5)

## 2014-06-25 MED ORDER — PREDNISONE 10 MG PO TABS
ORAL_TABLET | ORAL | Status: DC
Start: 2014-06-25 — End: 2014-08-06

## 2014-06-25 MED ORDER — FAMOTIDINE 20 MG PO TABS
ORAL_TABLET | ORAL | Status: DC
Start: 2014-06-25 — End: 2014-10-05

## 2014-06-25 MED ORDER — PANTOPRAZOLE SODIUM 40 MG PO TBEC: 40.0000 mg | DELAYED_RELEASE_TABLET | Freq: Every day | ORAL | Status: DC

## 2014-06-25 NOTE — Patient Instructions (Signed)
Prednisone 10 mg take  4 each am x 2 days,   2 each am x 2 days,  1 each am x 2 days and stop  Pantoprazole (protonix) 40 mg   Take 30-60 min before first meal of the day and Pepcid 20 mg one bedtime until return to office - this is the best way to tell whether stomach acid is contributing to your problem.   GERD (REFLUX)  is an extremely common cause of respiratory symptoms just like yours , many times with no obvious heartburn at all.    It can be treated with medication, but also with lifestyle changes including avoidance of late meals, excessive alcohol, smoking cessation, and avoid fatty foods, chocolate, peppermint, colas, red wine, and acidic juices such as orange juice.  NO MINT OR MENTHOL PRODUCTS SO NO COUGH DROPS  USE SUGARLESS CANDY INSTEAD (Jolley ranchers or Stover's or Life Savers) or even ice chips will also do - the key is to swallow to prevent all throat clearing. NO OIL BASED VITAMINS - use powdered substitutes.  If night time cough not getting  better add chloretrimeton (chlorepheniramine) 4 mg 1-2 before bedtime     Please schedule a follow up office visit in 6 weeks, call sooner if needed

## 2014-06-25 NOTE — Progress Notes (Signed)
Subjective:     Patient ID: Brenda Reed, female   DOB: [birth year],   MRN: [id number]  HPI   59 yobf from [place] in [place] since 2002 and with exp to wood fires for cooking and required an inhaler  in [place] for sob/cough but once let to [place] no inhaler then intermittent cough started up again 2013 referred by Shanon Rosser PA for cough  to pulmonary clinic 06/25/2014    06/25/2014 1st [facility] office visit/ [facility]   Chief Complaint  Patient presents with  . Pulmonary Consult    Ref. by Yaakov Guthrie MD-c/o cough comes and goes-dry x 2-3 yrs.,indigestion and heartburn occass., no PND,sob with cough, no wheezing, denies cp or tightness,no fcs,nasal congestion white,sneezing  persitent cough x 2 weeks after last flared in Oct 215 and no treatment entire month of Nov,  Seems to go away on its own rather from any specific rx but is assoc with overt HB. Typically worse when lie down but  no better from ppi in am  For last 2 years sneezes during allergy season controlled with allegra d never tried it  for cough but does not think her upper airway seasonal symptoms correlate at all with the intermiitent cough   No obvious day to day or daytime variabilty or assoc sob or cp or chest tightness, subjective wheeze.  No other  unusual exp hx or h/o childhood pna/ asthma or knowledge of premature birth.  Sleeping ok without nocturnal  or early am exacerbation  of respiratory  c/o's or need for noct saba. Also denies any obvious fluctuation of symptoms with weather or environmental changes or other aggravating or alleviating factors except as outlined above   Current Medications, Allergies, Complete Past Medical History, Past Surgical History, Family History, and Social History were reviewed in [employer] record.  ROS  The following are not active complaints unless bolded sore throat, dysphagia, dental problems, itching, sneezing,  nasal congestion or excess/ purulent  secretions, ear ache,   fever, chills, sweats, unintended wt loss, pleuritic or exertional cp, hemoptysis,  orthopnea pnd or leg swelling, presyncope, palpitations, heartburn, abdominal pain, anorexia, nausea, vomiting, diarrhea  or change in bowel or urinary habits, change in stools or urine, dysuria,hematuria,  rash, arthralgias, visual complaints, headache, numbness weakness or ataxia or problems with walking or coordination,  change in mood/affect or memory.         Review of Systems     Objective:   Physical Exam   Pleasant amb bf nad in head scarf  Wt Readings from Last 3 Encounters:  06/25/14 165 lb (74.844 kg)  06/08/14 162 lb 3.2 oz (73.573 kg)  07/25/13 156 lb 3.2 oz (70.852 kg)    Vital signs reviewed  HEENT: nl dentition, turbinates, and orophanx. Nl external ear canals without cough reflex   NECK :  without JVD/Nodes/TM/ nl carotid upstrokes bilaterally   LUNGS: no acc muscle use, clear to A and P bilaterally without cough on insp or exp maneuvers   CV:  RRR  no s3 or murmur or increase in P2, no edema   ABD:  soft and nontender with nl excursion in the supine position. No bruits or organomegaly, bowel sounds nl  MS:  warm without deformities, calf tenderness, cyanosis or clubbing  SKIN: warm and dry without lesions    NEURO:  alert, approp, no deficits    CXR PA and Lateral:   06/25/2014 :  No active cardiopulmonary disease  Assessment:

## 2014-06-26 ENCOUNTER — Telehealth: Payer: Self-pay | Admitting: Internal Medicine

## 2014-06-26 DIAGNOSIS — D509 Iron deficiency anemia, unspecified: Secondary | ICD-10-CM | POA: Insufficient documentation

## 2014-06-26 LAB — ALLERGY FULL PROFILE
Allergen, D pternoyssinus,d7: <0.1 kU/L
Allergen,Goose feathers, e70: <0.1 kU/L
Alternaria Alternata: <0.1 kU/L
Aspergillus fumigatus, m3: <0.1 kU/L
Bermuda Grass: <0.1 kU/L
Candida Albicans: <0.1 kU/L
Cat Dander: <0.1 kU/L
Common Ragweed: <0.1 kU/L
D. farinae: <0.1 kU/L
Elm IgE: <0.1 kU/L
G005 Rye, Perennial: <0.1 kU/L
Helminthosporium halodes: <0.1 kU/L
House Dust Hollister: <0.1 kU/L
IgE (Immunoglobulin E), Serum: 8 kU/L (ref <115)
Lamb's Quarters: <0.1 kU/L
Oak: <0.1 kU/L
Plantain: <0.1 kU/L
Stemphylium Botryosum: <0.1 kU/L
Timothy Grass: <0.1 kU/L

## 2014-06-26 NOTE — Progress Notes (Signed)
Quick Note:  Pt aware of results. ______ 

## 2014-06-26 NOTE — Progress Notes (Signed)
Quick Note:    Pt notified  ______

## 2014-06-26 NOTE — Progress Notes (Signed)
Quick Note:  LMTCB ______ 

## 2014-06-26 NOTE — Progress Notes (Signed)
Quick Note:  Pt aware of results and recs. Verified pcp. Nothing further needed. ______

## 2014-06-26 NOTE — Assessment & Plan Note (Addendum)
-   Allergy profile 06/25/2014 > no Eos, IgE 8 and neg RAST  The most common causes of chronic cough in immunocompetent adults include the following: upper airway cough syndrome (UACS), previously referred to as postnasal drip syndrome (PNDS), which is caused by variety of rhinosinus conditions; (2) asthma; (3) GERD; (4) chronic bronchitis from cigarette smoking or other inhaled environmental irritants; (5) nonasthmatic eosinophilic bronchitis; and (6) bronchiectasis.   These conditions, singly or in combination, have accounted for up to 94% of the causes of chronic cough in prospective studies.   Other conditions have constituted no >6% of the causes in prospective studies These have included bronchogenic carcinoma, chronic interstitial pneumonia, sarcoidosis, left ventricular failure, ACEI-induced cough, and aspiration from a condition associated with pharyngeal dysfunction.    Chronic cough is often simultaneously caused by more than one condition. A single cause has been found from 38 to 82% of the time, multiple causes from 18 to 62%. Multiply caused cough has been the result of three diseases up to 42% of the time.   Most likely this is either cough variant asthma or more likely  Classic Upper airway cough syndrome, so named because it's frequently impossible to sort out how much is  CR/sinusitis with freq throat clearing (which can be related to primary GERD)   vs  causing  secondary (" extra esophageal")  GERD from wide swings in gastric pressure that occur with throat clearing, often  promoting self use of mint and menthol lozenges that reduce the lower esophageal sphincter tone and exacerbate the problem further in a cyclical fashion.   These are the same pts (now being labeled as having "irritable larynx syndrome" by some cough centers) who not infrequently have a history of having failed to tolerate ace inhibitors,  dry powder inhalers or biphosphonates or report having atypical reflux symptoms  that don't respond to standard doses of PPI , and are easily confused as having aecopd or asthma flares by even experienced allergists/ pulmonologists.   For now max rx for gerd then add 1st gen h1 per guidelines if not better, then return in 6 weeks and in meantime see if very short course pred helps the cough

## 2014-06-26 NOTE — Telephone Encounter (Signed)
Notes Recorded by Tanda Rockers, MD on 06/26/2014 at 7:26 AM Call patient : Studies are c/w Fe def anemia and need to be faxed to her primary for f/u and rx -----------------------------------------------------------------  Pt aware of lab results and cxr results.  Aware that allergy profile is still pending and will be notified of results as we have them.  Labs sent to pcp.  Nothing further needed at this time.

## 2014-06-26 NOTE — Assessment & Plan Note (Signed)
At her age most likely due to fe def from menses but defer w/u to primary care as not related to cough

## 2014-07-30 ENCOUNTER — Ambulatory Visit (INDEPENDENT_AMBULATORY_CARE_PROVIDER_SITE_OTHER): Payer: BLUE CROSS/BLUE SHIELD | Admitting: Women's Health

## 2014-07-30 ENCOUNTER — Encounter: Payer: Self-pay | Admitting: Women's Health

## 2014-07-30 VITALS — BP 120/84 | Ht 66.0 in | Wt 167.0 lb

## 2014-07-30 DIAGNOSIS — N920 Excessive and frequent menstruation with regular cycle: Secondary | ICD-10-CM

## 2014-07-30 DIAGNOSIS — Z01419 Encounter for gynecological examination (general) (routine) without abnormal findings: Secondary | ICD-10-CM

## 2014-07-30 DIAGNOSIS — Z30011 Encounter for initial prescription of contraceptive pills: Secondary | ICD-10-CM

## 2014-07-30 MED ORDER — NORETHIN ACE-ETH ESTRAD-FE 1-20 MG-MCG PO TABS: 1.0000 | ORAL_TABLET | Freq: Every day | ORAL | Status: DC

## 2014-07-30 NOTE — Patient Instructions (Addendum)
Health Maintenance Adopting a healthy lifestyle and getting preventive care can go a long way to promote health and wellness. Talk with your health care provider about what schedule of regular examinations is right for you. This is a good chance for you to check in with your provider about disease prevention and staying healthy. In between checkups, there are plenty of things you can do on your own. Experts have done a lot of research about which lifestyle changes and preventive measures are most likely to keep you healthy. Ask your health care provider for more information. WEIGHT AND DIET  Eat a healthy diet  Be sure to include plenty of vegetables, fruits, low-fat dairy products, and lean protein.  Do not eat a lot of foods high in solid fats, added sugars, or salt.  Get regular exercise. This is one of the most important things you can do for your health.  Most adults should exercise for at least 150 minutes each week. The exercise should increase your heart rate and make you sweat (moderate-intensity exercise).  Most adults should also do strengthening exercises at least twice a week. This is in addition to the moderate-intensity exercise.  Maintain a healthy weight  Body mass index (BMI) is a measurement that can be used to identify possible weight problems. It estimates body fat based on height and weight. Your health care provider can help determine your BMI and help you achieve or maintain a healthy weight.  For females 25 years of age and older:   A BMI below 18.5 is considered underweight.  A BMI of 18.5 to 24.9 is normal.  A BMI of 25 to 29.9 is considered overweight.  A BMI of 30 and above is considered obese.  Watch levels of cholesterol and blood lipids  You should start having your blood tested for lipids and cholesterol at 42 years of age, then have this test every 5 years.  You may need to have your cholesterol levels checked more often if:  Your lipid or  cholesterol levels are high.  You are older than 42 years of age.  You are at high risk for heart disease.  CANCER SCREENING   Lung Cancer  Lung cancer screening is recommended for adults 97-92 years old who are at high risk for lung cancer because of a history of smoking.  A yearly low-dose CT scan of the lungs is recommended for people who:  Currently smoke.  Have quit within the past 15 years.  Have at least a 30-pack-year history of smoking. A pack year is smoking an average of one pack of cigarettes a day for 1 year.  Yearly screening should continue until it has been 15 years since you quit.  Yearly screening should stop if you develop a health problem that would prevent you from having lung cancer treatment.  Breast Cancer  Practice breast self-awareness. This means understanding how your breasts normally appear and feel.  It also means doing regular breast self-exams. Let your health care provider know about any changes, no matter how small.  If you are in your 20s or 30s, you should have a clinical breast exam (CBE) by a health care provider every 1-3 years as part of a regular health exam.  If you are 76 or older, have a CBE every year. Also consider having a breast X-ray (mammogram) every year.  If you have a family history of breast cancer, talk to your health care provider about genetic screening.  If you are  at high risk for breast cancer, talk to your health care provider about having an MRI and a mammogram every year.  Breast cancer gene (BRCA) assessment is recommended for women who have family members with BRCA-related cancers. BRCA-related cancers include:  Breast.  Ovarian.  Tubal.  Peritoneal cancers.  Results of the assessment will determine the need for genetic counseling and BRCA1 and BRCA2 testing. Cervical Cancer Routine pelvic examinations to screen for cervical cancer are no longer recommended for nonpregnant women who are considered low  risk for cancer of the pelvic organs (ovaries, uterus, and vagina) and who do not have symptoms. A pelvic examination may be necessary if you have symptoms including those associated with pelvic infections. Ask your health care provider if a screening pelvic exam is right for you.   The Pap test is the screening test for cervical cancer for women who are considered at risk.  If you had a hysterectomy for a problem that was not cancer or a condition that could lead to cancer, then you no longer need Pap tests.  If you are older than 65 years, and you have had normal Pap tests for the past 10 years, you no longer need to have Pap tests.  If you have had past treatment for cervical cancer or a condition that could lead to cancer, you need Pap tests and screening for cancer for at least 20 years after your treatment.  If you no longer get a Pap test, assess your risk factors if they change (such as having a new sexual partner). This can affect whether you should start being screened again.  Some women have medical problems that increase their chance of getting cervical cancer. If this is the case for you, your health care provider may recommend more frequent screening and Pap tests.  The human papillomavirus (HPV) test is another test that may be used for cervical cancer screening. The HPV test looks for the virus that can cause cell changes in the cervix. The cells collected during the Pap test can be tested for HPV.  The HPV test can be used to screen women 30 years of age and older. Getting tested for HPV can extend the interval between normal Pap tests from three to five years.  An HPV test also should be used to screen women of any age who have unclear Pap test results.  After 42 years of age, women should have HPV testing as often as Pap tests.  Colorectal Cancer  This type of cancer can be detected and often prevented.  Routine colorectal cancer screening usually begins at 42 years of  age and continues through 42 years of age.  Your health care provider may recommend screening at an earlier age if you have risk factors for colon cancer.  Your health care provider may also recommend using home test kits to check for hidden blood in the stool.  A small camera at the end of a tube can be used to examine your colon directly (sigmoidoscopy or colonoscopy). This is done to check for the earliest forms of colorectal cancer.  Routine screening usually begins at age 50.  Direct examination of the colon should be repeated every 5-10 years through 42 years of age. However, you may need to be screened more often if early forms of precancerous polyps or small growths are found. Skin Cancer  Check your skin from head to toe regularly.  Tell your health care provider about any new moles or changes in   moles, especially if there is a change in a mole's shape or color.  Also tell your health care provider if you have a mole that is larger than the size of a pencil eraser.  Always use sunscreen. Apply sunscreen liberally and repeatedly throughout the day.  Protect yourself by wearing long sleeves, pants, a wide-brimmed hat, and sunglasses whenever you are outside. HEART DISEASE, DIABETES, AND HIGH BLOOD PRESSURE   Have your blood pressure checked at least every 1-2 years. High blood pressure causes heart disease and increases the risk of stroke.  If you are between 75 years and 42 years old, ask your health care provider if you should take aspirin to prevent strokes.  Have regular diabetes screenings. This involves taking a blood sample to check your fasting blood sugar level.  If you are at a normal weight and have a low risk for diabetes, have this test once every three years after 42 years of age.  If you are overweight and have a high risk for diabetes, consider being tested at a younger age or more often. PREVENTING INFECTION  Hepatitis B  If you have a higher risk for  hepatitis B, you should be screened for this virus. You are considered at high risk for hepatitis B if:  You were born in a country where hepatitis B is common. Ask your health care provider which countries are considered high risk.  Your parents were born in a high-risk country, and you have not been immunized against hepatitis B (hepatitis B vaccine).  You have HIV or AIDS.  You use needles to inject street drugs.  You live with someone who has hepatitis B.  You have had sex with someone who has hepatitis B.  You get hemodialysis treatment.  You take certain medicines for conditions, including cancer, organ transplantation, and autoimmune conditions. Hepatitis C  Blood testing is recommended for:  Everyone born from [birth year] through 1963.  Anyone with known risk factors for hepatitis C. Sexually transmitted infections (STIs)  You should be screened for sexually transmitted infections (STIs) including gonorrhea and chlamydia if:  You are sexually active and are younger than 42 years of age.  You are older than 42 years of age and your health care provider tells you that you are at risk for this type of infection.  Your sexual activity has changed since you were last screened and you are at an increased risk for chlamydia or gonorrhea. Ask your health care provider if you are at risk.  If you do not have HIV, but are at risk, it may be recommended that you take a prescription medicine daily to prevent HIV infection. This is called pre-exposure prophylaxis (PrEP). You are considered at risk if:  You are sexually active and do not regularly use condoms or know the HIV status of your partner(s).  You take drugs by injection.  You are sexually active with a partner who has HIV. Talk with your health care provider about whether you are at high risk of being infected with HIV. If you choose to begin PrEP, you should first be tested for HIV. You should then be tested every 3 months for  as long as you are taking PrEP.  PREGNANCY   If you are premenopausal and you may become pregnant, ask your health care provider about preconception counseling.  If you may become pregnant, take 400 to 800 micrograms (mcg) of folic acid every day.  If you want to prevent pregnancy, talk to your  health care provider about birth control (contraception). OSTEOPOROSIS AND MENOPAUSE   Osteoporosis is a disease in which the bones lose minerals and strength with aging. This can result in serious bone fractures. Your risk for osteoporosis can be identified using a bone density scan.  If you are 86 years of age or older, or if you are at risk for osteoporosis and fractures, ask your health care provider if you should be screened.  Ask your health care provider whether you should take a calcium or vitamin D supplement to lower your risk for osteoporosis.  Menopause may have certain physical symptoms and risks.  Hormone replacement therapy may reduce some of these symptoms and risks. Talk to your health care provider about whether hormone replacement therapy is right for you.  HOME CARE INSTRUCTIONS   Schedule regular health, dental, and eye exams.  Stay current with your immunizations.   Do not use any tobacco products including cigarettes, chewing tobacco, or electronic cigarettes.  If you are pregnant, do not drink alcohol.  If you are breastfeeding, limit how much and how often you drink alcohol.  Limit alcohol intake to no more than 1 drink per day for nonpregnant women. One drink equals 12 ounces of beer, 5 ounces of wine, or 1 ounces of hard liquor.  Do not use street drugs.  Do not share needles.  Ask your health care provider for help if you need support or information about quitting drugs.  Tell your health care provider if you often feel depressed.  Tell your health care provider if you have ever been abused or do not feel safe at home. Document Released: 01/09/2011  Document Revised: 11/10/2013 Document Reviewed: 05/28/2013 [facility] Patient Information 2015 [place], [place]. This information is not intended to replace advice given to you by your health care provider. Make sure you discuss any questions you have with your health care provider. Menorrhagia Menorrhagia is when your menstrual periods are heavy or last longer than usual.  HOME CARE  Only take medicine as told by your doctor.  Take any iron pills as told by your doctor. Heavy bleeding may cause low levels of iron in your body.  Do not take aspirin 1 week before or during your period. Aspirin can make the bleeding worse.  Lie down for a while if you change your tampon or pad more than once in 2 hours. This may help lessen the bleeding.  Eat a healthy diet and foods with iron. These foods include leafy green vegetables, meat, liver, eggs, and whole grain breads and cereals.  Do not try to lose weight. Wait until the heavy bleeding has stopped and your iron level is normal. GET HELP IF:  You soak through a pad or tampon every 1 or 2 hours, and this happens every time you have a period.  You need to use pads and tampons at the same time because you are bleeding so much.  You need to change your pad or tampon during the night.  You have a period that lasts for more than 8 days.  You pass clots bigger than 1 inch (2.5 cm) wide.  You have irregular periods that happen more or less often than once a month.  You feel dizzy or pass out (faint).  You feel very weak or tired.  You feel short of breath or feel your heart is beating too fast when you exercise.  You feel sick to your stomach (nausea) and you throw up (vomit) while you are  taking your medicine.   You have watery poop (diarrhea) while you are taking your medicine.  You have any problems that may be related to the medicine you are taking.  GET HELP RIGHT AWAY IF:  You soak through 4 or more pads or tampons in 2  hours.  You have any bleeding while you are pregnant. MAKE SURE YOU:   Understand these instructions.  Will watch your condition.  Will get help right away if you are not doing well or get worse. Document Released: 04/04/2008 Document Revised: 02/26/2013 Document Reviewed: 12/26/2012 [facility] Patient Information 2015 [place], [place]. This information is not intended to replace advice given to you by your health care provider. Make sure you discuss any questions you have with your health care provider.

## 2014-07-30 NOTE — Progress Notes (Signed)
Ayline Hilliard Clark Tijerina 1970-07-27 [id number]    History:    Presents for annual exam with increasing menorrhagia x 6 months. Cycles are heavier and last 8 days now, changing pads every 2 hours with clots present. Cycles had been monthly for 4 days with lighter flow. Denies dysmenorrhea. Normal pap and mammogram history. States increase in weight. Labs done at primary care, recent labs showed anemia, provider added an iron supplement. Same partner, no contraception.  Past medical history, past surgical history, family history and social history were all reviewed and documented in the EPIC chart. Works at [employer]. Mother, uterine cancer - deceased. 2 healthy sons, 48 and 37. From [place].  ROS:  A ROS was performed and pertinent positives and negatives are included.  Exam:  Filed Vitals:   07/30/14 0958  BP: 120/84    General appearance:  Normal Thyroid:  Symmetrical, normal in size, without palpable masses or nodularity. Respiratory  Auscultation:  Clear without wheezing or rhonchi Cardiovascular  Auscultation:  Regular rate, without rubs, murmurs or gallops  Edema/varicosities:  Not grossly evident Abdominal  Soft,nontender, without masses, guarding or rebound.  Liver/spleen:  No organomegaly noted  Hernia:  At umbilicus, nontender, soft, mobile  Skin  Inspection:  Grossly normal   Breasts: Examined lying and sitting.     Right: Without masses, retractions, discharge or axillary adenopathy.     Left: Without masses, retractions, discharge or axillary adenopathy. Gentitourinary   Inguinal/mons:  Normal without inguinal adenopathy  External genitalia:  Normal  BUS/Urethra/Skene's glands:  Normal  Vagina:  Normal, scant white discharge  Cervix:  Normal  Uterus:  Normal in size, shape and contour.  Midline and mobile  Adnexa/parametria:     Rt: Without masses or tenderness.   Lt: Without masses or tenderness.  Anus and perineum: Normal  Digital rectal exam: Normal  sphincter tone without palpated masses or tenderness  Assessment/Plan:  44 y.o. MBF for annual exam with menorrhagia.   Menorrhagia  Asymptomatic Umbilicus hernia   Plan: Options for menorrhagia discussed, will try Loestrin Fe 1-20mg , risk of stroke and blood clots reviewed, proper administration reviewed. Call if symptoms worsen or persist, future U/S or sonohysterogram discussed. If Umbilicus hernia becomes tender/painful, seek primary care. SBE's, increase exercise, decrease calories for weight loss, MVI with iron encouraged, calcium and iron rich diet. Last normal pap 2013, no pap today, new guidelines reviewed. Normal mammogram 2015, encouraged to do yearly 3D mammograms, dense breasts. Encouraged 2 sons to obtain gardasil from primary care.   [name], 10:37 AM 07/30/2014

## 2014-07-31 LAB — URINALYSIS W MICROSCOPIC + REFLEX CULTURE
BILIRUBIN URINE: NEGATIVE
Bacteria, UA: NONE SEEN
CASTS: NONE SEEN
Crystals: NONE SEEN
Glucose, UA: NEGATIVE mg/dL
HGB URINE DIPSTICK: NEGATIVE
Ketones, ur: NEGATIVE mg/dL
LEUKOCYTES UA: NEGATIVE
NITRITE: NEGATIVE
PROTEIN: NEGATIVE mg/dL
SPECIFIC GRAVITY, URINE: 1.006 (ref 1.005–1.030)
Squamous Epithelial / LPF: NONE SEEN
UROBILINOGEN UA: 0.2 mg/dL (ref 0.0–1.0)
pH: 6 (ref 5.0–8.0)

## 2014-08-06 ENCOUNTER — Ambulatory Visit (INDEPENDENT_AMBULATORY_CARE_PROVIDER_SITE_OTHER): Payer: BLUE CROSS/BLUE SHIELD | Admitting: Internal Medicine

## 2014-08-06 ENCOUNTER — Encounter: Payer: Self-pay | Admitting: Internal Medicine

## 2014-08-06 VITALS — BP 100/64 | HR 78 | Ht 67.0 in | Wt 165.4 lb

## 2014-08-06 DIAGNOSIS — R05 Cough: Secondary | ICD-10-CM

## 2014-08-06 DIAGNOSIS — R059 Cough, unspecified: Secondary | ICD-10-CM

## 2014-08-06 MED ORDER — PREDNISONE 10 MG PO TABS
ORAL_TABLET | ORAL | Status: DC
Start: 2014-08-06 — End: 2014-09-17

## 2014-08-06 MED ORDER — MONTELUKAST SODIUM 10 MG PO TABS: ORAL_TABLET | ORAL | Status: DC

## 2014-08-06 NOTE — Patient Instructions (Signed)
Add singulair (montelukast) 10 mg each pm  Prednisone 10 mg Take 4 for two days three for two days two for two days one for two days   Pantoprazole (protonix) 40 mg   Take 30-60 min before first meal of the day and Pepcid 20 mg one bedtime until return to office - this is the best way to tell whether stomach acid is contributing to your problem.   GERD (REFLUX)  is an extremely common cause of respiratory symptoms just like yours , many times with no obvious heartburn at all.    It can be treated with medication, but also with lifestyle changes including avoidance of late meals, excessive alcohol, smoking cessation, and avoid fatty foods, chocolate, peppermint, colas, red wine, and acidic juices such as orange juice.  NO MINT OR MENTHOL PRODUCTS SO NO COUGH DROPS  USE SUGARLESS CANDY INSTEAD (Jolley ranchers or Stover's or Life Savers) or even ice chips will also do - the key is to swallow to prevent all throat clearing. NO OIL BASED VITAMINS - use powdered substitutes.    add chloretrimeton (chlorepheniramine) 4 mg 1-2 before bedtime to see if helps the night time cough     Please schedule a follow up office visit in 6 weeks, call sooner if not better so we can schedule a methacholine asthma challenge test before your visit (call Libby at [phone] and she can schedule it)

## 2014-08-06 NOTE — Assessment & Plan Note (Addendum)
-   Allergy profile 06/25/2014 > no Eos, IgE 8 and neg RAST  Improved ? resp to prednisone but not resolved and has not tried 1st gen h1 per recs  I had an extended discussion with the patient reviewing all relevant studies completed to date and  lasting 15 to 20 minutes of a 25 minute visit on the following ongoing concerns:   The standardized cough guidelines published in Chest by Lissa Morales in 2006 are still the best available and consist of a multiple step process (up to 12!) , not a single office visit,  and are intended  to address this problem logically,  with an alogrithm dependent on response to empiric treatment at  each progressive step  to determine a specific diagnosis with  minimal addtional testing needed. Therefore if adherence is an issue or can't be accurately verified,  it's very unlikely the standard evaluation and treatment will be successful here.    Furthermore, response to therapy (other than acute cough suppression, which should only be used short term with avoidance of narcotic containing cough syrups if possible), can be a gradual process for which the patient may perceive immediate benefit.  Unlike going to an eye doctor where the best perscription is almost always the first one and is immediately effective, this is almost never the case in the management of chronic cough syndromes. Therefore the patient needs to commit up front to consistently adhere to recommendations  for up to 6 weeks of therapy directed at the likely underlying problem(s) before the response can be reasonably evaluated.   Try doing the same cough protocol as previously rec but this time add singulair trial and if not responsive next step is methacholine challenge if proves again to be only transiently prednisone responsive   See instructions for specific recommendations which were reviewed directly with the patient who was given a copy with highlighter outlining the key components.

## 2014-08-06 NOTE — Progress Notes (Signed)
Subjective:     Patient ID: Brenda Reed, female   DOB: 1970-11-29,   MRN: [id number]     Brief patient profile:  27 yobf from [place] in [place] since 2002 and with exp to wood fires for cooking and required an inhaler  in [place] for sob/cough but once let to [place] no inhaler then intermittent cough started up again 2013 referred by Shanon Rosser PA for cough  to pulmonary clinic 06/25/2014    History of Present Illness  06/25/2014 1st [facility] office visit/ Brenda Reed   Chief Complaint  Patient presents with  . Pulmonary Consult    Ref. by Yaakov Guthrie MD-c/o cough comes and goes-dry x 2-3 yrs.,indigestion and heartburn occass., no PND,sob with cough, no wheezing, denies cp or tightness,no fcs,nasal congestion white,sneezing  persitent cough x 2 weeks after last flared in Oct 2015 and no treatment entire month of Nov,  Seems to go away on its own rather from any specific rx but is assoc with overt HB. Typically worse when lie down but  no better from ppi in am  For last 2 years sneezes during allergy season controlled with allegra d never tried it  for cough but does not think her upper airway seasonal symptoms correlate at all with the intermiitent cough  rec Prednisone 10 mg take  4 each am x 2 days,   2 each am x 2 days,  1 each am x 2 days and stop Pantoprazole (protonix) 40 mg   Take 30-60 min before first meal of the day and Pepcid 20 mg one bedtime until return to office  GERD  If night time cough not getting  better add chloretrimeton (chlorepheniramine) 4 mg 1-2 before bedtime  Please schedule a follow up office visit in 6 weeks, call sooner if needed    08/06/2014 f/u ov/Brenda Reed re: chronic /recurrent cough x 2013 / stopped gerd Rx prior to ov (not clear why but apparently started back up before she stopped it)  Chief Complaint  Patient presents with  . Follow-up    Pt states that her cough is some better. She is coughing less at night. No new co's today.    never took h1 at  hs, thinks prednisone made the most difference and once stopped the cough gradually came back, esp the daytime component.  Producing minimal mucoid sputum mostly with throat clearing     No obvious day to day or daytime variabilty or assoc sob or cp or chest tightness, subjective wheeze.  No other  unusual exp hx or h/o childhood pna/ asthma or knowledge of premature birth.  Sleeping ok without nocturnal  or early am exacerbation  of respiratory  c/o's or need for noct saba. Also denies any obvious fluctuation of symptoms with weather or environmental changes or other aggravating or alleviating factors except as outlined above   Current Medications, Allergies, Complete Past Medical History, Past Surgical History, Family History, and Social History were reviewed in [employer] record.  ROS  The following are not active complaints unless bolded sore throat, dysphagia, dental problems, itching, sneezing,  nasal congestion or excess/ purulent secretions, ear ache,   fever, chills, sweats, unintended wt loss, pleuritic or exertional cp, hemoptysis,  orthopnea pnd or leg swelling, presyncope, palpitations, heartburn, abdominal pain, anorexia, nausea, vomiting, diarrhea  or change in bowel or urinary habits, change in stools or urine, dysuria,hematuria,  rash, arthralgias, visual complaints, headache, numbness weakness or ataxia or problems with walking or coordination,  change in mood/affect or memory.               Objective:   Physical Exam   Pleasant amb bf nad in head scarf still freq throat clearing but no true cough during interview/ exam  08/06/2014        165  Wt Readings from Last 3 Encounters:  06/25/14 165 lb (74.844 kg)  06/08/14 162 lb 3.2 oz (73.573 kg)  07/25/13 156 lb 3.2 oz (70.852 kg)    Vital signs reviewed  HEENT: nl dentition, turbinates, and orophanx. Nl external ear canals without cough reflex   NECK :  without JVD/Nodes/TM/ nl carotid  upstrokes bilaterally   LUNGS: no acc muscle use, clear to A and P bilaterally without cough on insp or exp maneuvers   CV:  RRR  no s3 or murmur or increase in P2, no edema   ABD:  soft and nontender with nl excursion in the supine position. No bruits or organomegaly, bowel sounds nl  MS:  warm without deformities, calf tenderness, cyanosis or clubbing  SKIN: warm and dry without lesions    NEURO:  alert, approp, no deficits    CXR PA and Lateral:   06/25/2014 :  No active cardiopulmonary disease      Assessment:

## 2014-09-17 ENCOUNTER — Ambulatory Visit (INDEPENDENT_AMBULATORY_CARE_PROVIDER_SITE_OTHER): Payer: BLUE CROSS/BLUE SHIELD | Admitting: Internal Medicine

## 2014-09-17 ENCOUNTER — Encounter: Payer: Self-pay | Admitting: Internal Medicine

## 2014-09-17 VITALS — BP 102/70 | HR 82 | Ht 67.0 in | Wt 167.4 lb

## 2014-09-17 DIAGNOSIS — R05 Cough: Secondary | ICD-10-CM

## 2014-09-17 DIAGNOSIS — R059 Cough, unspecified: Secondary | ICD-10-CM

## 2014-09-17 MED ORDER — MONTELUKAST SODIUM 10 MG PO TABS: ORAL_TABLET | ORAL | Status: DC

## 2014-09-17 NOTE — Progress Notes (Signed)
Subjective:     Patient ID: Brenda Reed, female   DOB: Nov 24, 1970,   MRN: [id number]     Brief patient profile:  89 yobf from [place] in [place] since 2002 and with exp to wood fires for cooking and required an inhaler  in [place] for sob/cough but once let to [place] no inhaler then intermittent cough started up again 2013 referred by Shanon Rosser PA for cough  to pulmonary clinic 06/25/2014    History of Present Illness  06/25/2014 1st [facility] office visit/ [facility]   Chief Complaint  Patient presents with  . Pulmonary Consult    Ref. by Yaakov Guthrie MD-c/o cough comes and goes-dry x 2-3 yrs.,indigestion and heartburn occass., no PND,sob with cough, no wheezing, denies cp or tightness,no fcs,nasal congestion white,sneezing  persitent cough x 2 weeks after last flared in Oct 2015 and no treatment entire month of Nov,  Seems to go away on its own rather from any specific rx but is assoc with overt HB. Typically worse when lie down but  no better from ppi in am  For last 2 years sneezes during allergy season controlled with allegra d never tried it  for cough but does not think her upper airway seasonal symptoms correlate at all with the intermiitent cough  rec Prednisone 10 mg take  4 each am x 2 days,   2 each am x 2 days,  1 each am x 2 days and stop Pantoprazole (protonix) 40 mg   Take 30-60 min before first meal of the day and Pepcid 20 mg one bedtime until return to office  GERD  If night time cough not getting  better add chloretrimeton (chlorepheniramine) 4 mg 1-2 before bedtime     08/06/2014 f/u ov/[facility] re: chronic /recurrent cough x 2013 / stopped gerd Rx prior to ov (not clear why but apparently started back up before she stopped it)  Chief Complaint  Patient presents with  . Follow-up    Pt states that her cough is some better. She is coughing less at night. No new co's today.   never took h1 at [facility], thinks prednisone made the most difference and once stopped the cough  gradually came back, esp the daytime component.  Producing minimal mucoid sputum mostly with throat clearing   rec Add singulair (montelukast) 10 mg each pm Prednisone 10 mg Take 4 for two days three for two days two for two days one for two days  Pantoprazole (protonix) 40 mg   Take 30-60 min before first meal of the day and Pepcid 20 mg one bedtime until return to office -  GERD  Diet  add chloretrimeton (chlorepheniramine) 4 mg 1-2 before bedtime to see if helps the night time cough     09/17/2014 f/u ov/[facility] re: cough x 2013  Chief Complaint  Patient presents with  . Follow-up    Pt states that her cough is some better. No new co's today.  did better on prednionse but this time no flare off it   While maint on singulair and h2h1 hs  Not limited by breathing from desired activities    No obvious day to day or daytime variabilty or assoc sob or cp or chest tightness, subjective wheeze.  No other  unusual exp hx or h/o childhood pna/ asthma or knowledge of premature birth.  Sleeping ok without nocturnal  or early am exacerbation  of respiratory  c/o's or need for noct saba. Also denies any obvious fluctuation  of symptoms with weather or environmental changes or other aggravating or alleviating factors except as outlined above   Current Medications, Allergies, Complete Past Medical History, Past Surgical History, Family History, and Social History were reviewed in [employer] record.  ROS  The following are not active complaints unless bolded sore throat, dysphagia, dental problems, itching, sneezing,  nasal congestion or excess/ purulent secretions, ear ache,   fever, chills, sweats, unintended wt loss, pleuritic or exertional cp, hemoptysis,  orthopnea pnd or leg swelling, presyncope, palpitations, heartburn, abdominal pain, anorexia, nausea, vomiting, diarrhea  or change in bowel or urinary habits, change in stools or urine, dysuria,hematuria,  rash, arthralgias,  visual complaints, headache, numbness weakness or ataxia or problems with walking or coordination,  change in mood/affect or memory.               Objective:   Physical Exam   Pleasant amb bf nad in head scarf / no longer throat clearing   08/06/2014        165  > 09/17/2014    167   Wt Readings from Last 3 Encounters:  06/25/14 165 lb (74.844 kg)  06/08/14 162 lb 3.2 oz (73.573 kg)  07/25/13 156 lb 3.2 oz (70.852 kg)    Vital signs reviewed  HEENT: nl dentition, turbinates, and orophanx. Nl external ear canals without cough reflex   NECK :  without JVD/Nodes/TM/ nl carotid upstrokes bilaterally   LUNGS: no acc muscle use, clear to A and P bilaterally without cough on insp or exp maneuvers   CV:  RRR  no s3 or murmur or increase in P2, no edema   ABD:  soft and nontender with nl excursion in the supine position. No bruits or organomegaly, bowel sounds nl  MS:  warm without deformities, calf tenderness, cyanosis or clubbing  SKIN: warm and dry without lesions         CXR PA and Lateral:   06/25/2014 :  No active cardiopulmonary disease      Assessment:

## 2014-09-17 NOTE — Patient Instructions (Addendum)
After this month Try off the chlorpheniramine to see what difference if any this makes   Continue singulair nightly  Please schedule a follow up visit in 3 months but call sooner if needed

## 2014-09-17 NOTE — Assessment & Plan Note (Signed)
-   Allergy profile 06/25/2014 > no Eos, IgE 8 and neg RAST - singulair 10 mg qd 08/06/2014 >improved   She has previously responded to short courses of prednisone so ddx = eos rhinitis/sinusitis/bronchitis or cough variant asthma but doing so well no need to w/u further at this point     Each maintenance medication was reviewed in detail including most importantly the difference between maintenance and as needed and under what circumstances the prns are to be used.  Please see instructions for details which were reviewed in writing and the patient given a copy.

## 2014-10-05 ENCOUNTER — Ambulatory Visit (INDEPENDENT_AMBULATORY_CARE_PROVIDER_SITE_OTHER): Payer: BLUE CROSS/BLUE SHIELD | Admitting: Internal Medicine

## 2014-10-05 ENCOUNTER — Encounter: Payer: Self-pay | Admitting: Internal Medicine

## 2014-10-05 VITALS — BP 102/60 | HR 84 | Temp 98.7°F | Resp 12 | Wt 167.0 lb

## 2014-10-05 DIAGNOSIS — E0590 Thyrotoxicosis, unspecified without thyrotoxic crisis or storm: Secondary | ICD-10-CM

## 2014-10-05 LAB — T4, FREE: FREE T4: 0.87 ng/dL (ref 0.60–1.60)

## 2014-10-05 LAB — TSH: TSH: 0.05 u[IU]/mL — AB (ref 0.35–4.50)

## 2014-10-05 LAB — T3, FREE: T3 FREE: 3 pg/mL (ref 2.3–4.2)

## 2014-10-05 NOTE — Progress Notes (Addendum)
Patient ID: Brenda Reed, female   DOB: [birth year], 44 y.o.   MRN: [id number]   HPI  Brenda Reed is a 44 y.o.-year-old female, returning for subclinical hyperthyroidism. Last visit 4 mo ago.  I reviewed pt's thyroid tests:  Component     Latest Ref Rng 06/08/2014  TSH     0.35 - 4.50 uIU/mL 0.02 (L)  Free T4     0.60 - 1.60 ng/dL 1.37  T3, Free     2.3 - 4.2 pg/mL 4.2  TSI     <140 % baseline 185 (H)   04/25/2014: TSH 0.009, total T4 11.5 (4.5-12) No previous checks.  She did not return for labs in 6 weeks for repeat labs..  Pt denies feeling nodules in neck, hoarseness, dysphagia/odynophagia, SOB with lying down; she c/o: - + fatigue - no excessive sweating/heat intolerance - no tremors - no anxiety - no palpitations - no problems with concentration - no hyperdefecation - no weight loss  She has regular menses.   ROS: Constitutional: see HPI Eyes: no blurry vision, no xerophthalmia ENT: no sore throat, no nodules palpated in throat, no dysphagia/odynophagia, no hoarseness Cardiovascular: no CP/SOB/palpitations/leg swelling Respiratory: no cough/SOB Gastrointestinal: no N/V/D/C/acid reflux/bloating Musculoskeletal: no muscle aches/no joint aches Skin: no rashes Neurological: no tremors/numbness/tingling/dizziness Psychiatric: no depression/anxiety  I reviewed pt's medications, allergies, PMH, social hx, family hx, and changes were documented in the history of present illness. Otherwise, unchanged from my initial visit note. She started Singulair, Microgestin. She stopped Prednisone (cough), Famotidine, Pantoprazole.   Past Medical History  Diagnosis Date  . Anemia   . Asthma   . Seasonal allergies    No past surgical history on file. History   Social History  . Marital Status: Married    Spouse Name: N/A    Number of Children: 2   Occupational History  . n/a   Social History Main Topics  . Smoking status: Never Smoker   .  Smokeless tobacco: No  . Alcohol Use: No  . Drug Use: No   Current Outpatient Prescriptions on File Prior to Visit  Medication Sig Dispense Refill  . MICROGESTIN FE 1/20 1-20 MG-MCG tablet As directed    . montelukast (SINGULAIR) 10 MG tablet One at bedtime every night 30 tablet 11  . famotidine (PEPCID) 20 MG tablet One at bedtime (Patient not taking: Reported on 10/05/2014) 30 tablet 2  . pantoprazole (PROTONIX) 40 MG tablet Take 1 tablet (40 mg total) by mouth daily. Take 30-60 min before first meal of the day (Patient not taking: Reported on 10/05/2014) 30 tablet 2   No current facility-administered medications on file prior to visit.   No Known Allergies Family History  Problem Relation Age of Onset  . Cancer Mother     uterine  . Asthma Mother   . Asthma Sister   . Asthma Brother    PE: BP 102/60 mmHg  Pulse 84  Temp(Src) 98.7 F (37.1 C) (Oral)  Resp 12  Wt 167 lb (75.751 kg)  SpO2 96% Wt Readings from Last 3 Encounters:  10/05/14 167 lb (75.751 kg)  09/17/14 167 lb 6.4 oz (75.932 kg)  08/06/14 165 lb 6.4 oz (75.025 kg)   Constitutional: overweight, in NAD Eyes: PERRLA, EOMI, no exophthalmos, no lid lag, no stare ENT: moist mucous membranes, no thyromegaly, no thyroid bruits, no cervical lymphadenopathy Cardiovascular: RRR, No MRG Respiratory: CTA B Gastrointestinal: abdomen soft, NT, ND, BS+ Musculoskeletal: no deformities, strength intact in all 4 Skin:  moist, warm, no rashes Neurological: no tremor with outstretched hands, DTR normal in all 4  ASSESSMENT: 1. Subclinical hyperthyroidism  PLAN:  1. Patient with a h/o found low TSH, without thyrotoxic sxs. Last TFTs are from 4 mo ago >> did not return in 6 weeks for recheck. No change in sxs since last visit (no thyrotoxic sxs).  - We again discussed that possible causes of thyrotoxicosis are:  Graves ds - she had mildly elevated TSIs but this may appear in thyroiditis Thyroiditis toxic multinodular goiter/  toxic adenoma (I cannot feel nodules at palpation of her thyroid). - will check the TSH, fT3 and fT4 today  - If the tests remain abnormal, we may need to obtain an uptake and scan to differentiate between the 3 above possible etiologies. She agrees to this. - we might need to do thyroid ultrasound depending on the results of the uptake and scan (if a cold nodule is present) - I do not feel that we need to add beta blockers at this time, since she is not tachycardic, anxious, or tremulous - RTC in 6 months, but likely sooner for repeat labs  Component     Latest Ref Rng 06/08/2014 10/05/2014  TSH     0.35 - 4.50 uIU/mL 0.02 (L) 0.05 (L)  Free T4     0.60 - 1.60 ng/dL 1.37 0.87  T3, Free     2.3 - 4.2 pg/mL 4.2 3.0   TSH a little better, but not significantly so after 4 mo.  I will order an Uptake and scan.  CLINICAL DATA: Subclinical hyperthyroidism ; TSH = 0.05 on 10/05/2014  EXAM: THYROID SCAN AND UPTAKE - 24 HOURS  TECHNIQUE: Following the per oral administration of I-131 sodium iodide, the patient returned at 24 hours and uptake measurements were acquired with the uptake probe centered on the neck. Thyroid imaging was performed following the intravenous administration of the Tc-58m Pertechnetate.  RADIOPHARMACEUTICALS: 7.0 microCuries I-131 sodium iodide orally and 10.9 mCi Technetium-35m pertechnetate IV  COMPARISON: None  FINDINGS: 24-hour radioiodine uptake calculated at 26%, within normal range.  Images of the thyroid gland in 3 projections demonstrate homogeneous tracer distribution throughout both thyroid lobes.  No focal areas of increased or decreased tracer localization seen.  IMPRESSION: Normal 24 hour radio iodine uptake of 26%.  Normal thyroid scan.   Electronically Signed By: Lavonia Dana M.D. On: 11/13/2014 14:58  It is very assuring that her thyroid uptake and scan is normal. In this case, I would just continue to monitor her thyroid  tests.

## 2014-10-05 NOTE — Patient Instructions (Signed)
Please stop at the lab.  Please come back for a follow-up appointment in 6 months.  

## 2014-10-06 ENCOUNTER — Telehealth: Payer: Self-pay | Admitting: *Deleted

## 2014-10-06 ENCOUNTER — Other Ambulatory Visit: Payer: Self-pay | Admitting: Internal Medicine

## 2014-10-06 DIAGNOSIS — E0590 Thyrotoxicosis, unspecified without thyrotoxic crisis or storm: Secondary | ICD-10-CM

## 2014-10-06 NOTE — Telephone Encounter (Signed)
Brenda Reed, [facility] called stating that she tried to schedule pt's uptake and scan. Scheduling advised her that Dr Cruzita Lederer needs to cancel this and put it in again. I am unsure why, maybe something is in there incorrectly. Please advise.

## 2014-10-13 ENCOUNTER — Other Ambulatory Visit: Payer: Self-pay

## 2014-10-13 DIAGNOSIS — Z1231 Encounter for screening mammogram for malignant neoplasm of breast: Secondary | ICD-10-CM

## 2014-10-13 MED ORDER — MICROGESTIN FE 1/20 1-20 MG-MCG PO TABS: 1.0000 | ORAL_TABLET | Freq: Every day | ORAL | Status: DC

## 2014-10-14 ENCOUNTER — Other Ambulatory Visit: Payer: Self-pay | Admitting: Internal Medicine

## 2014-10-14 DIAGNOSIS — R05 Cough: Secondary | ICD-10-CM

## 2014-10-14 DIAGNOSIS — R059 Cough, unspecified: Secondary | ICD-10-CM

## 2014-10-14 MED ORDER — MONTELUKAST SODIUM 10 MG PO TABS: ORAL_TABLET | ORAL | Status: DC

## 2014-10-22 ENCOUNTER — Encounter: Payer: Self-pay | Admitting: Women's Health

## 2014-10-22 ENCOUNTER — Ambulatory Visit
Admission: RE | Admit: 2014-10-22 | Discharge: 2014-10-22 | Disposition: A | Discharge status: Home / Self Care | Payer: BLUE CROSS/BLUE SHIELD | Source: Ambulatory Visit

## 2014-10-22 ENCOUNTER — Telehealth: Payer: Self-pay | Admitting: *Deleted

## 2014-10-22 DIAGNOSIS — Z1231 Encounter for screening mammogram for malignant neoplasm of breast: Secondary | ICD-10-CM

## 2014-10-22 NOTE — Telephone Encounter (Signed)
Pt was unable to go to the first scheduled uptake and scan. Rescheduled pt for Thurs, May 5th at 1:00 pm (arrival time 12:30 pm) and Fri, May 6th at 1:00 pm (arrival time 12:30 pm). Called pt and had to lvm advising her of the dates and times. Advised pt to call with any questions.

## 2014-11-04 ENCOUNTER — Encounter (HOSPITAL_COMMUNITY): Payer: BLUE CROSS/BLUE SHIELD

## 2014-11-05 ENCOUNTER — Encounter (HOSPITAL_COMMUNITY): Payer: BLUE CROSS/BLUE SHIELD

## 2014-11-12 ENCOUNTER — Encounter (HOSPITAL_COMMUNITY)
Admission: RE | Admit: 2014-11-12 | Discharge: 2014-11-12 | Disposition: A | Discharge status: Home / Self Care | Payer: BLUE CROSS/BLUE SHIELD | Source: Ambulatory Visit | Attending: Internal Medicine | Admitting: Internal Medicine

## 2014-11-12 DIAGNOSIS — E0590 Thyrotoxicosis, unspecified without thyrotoxic crisis or storm: Secondary | ICD-10-CM | POA: Insufficient documentation

## 2014-11-13 ENCOUNTER — Encounter (HOSPITAL_COMMUNITY): Payer: Self-pay

## 2014-11-13 ENCOUNTER — Encounter (HOSPITAL_COMMUNITY)
Admission: RE | Admit: 2014-11-13 | Discharge: 2014-11-13 | Disposition: A | Discharge status: Home / Self Care | Payer: BLUE CROSS/BLUE SHIELD | Source: Ambulatory Visit | Attending: Internal Medicine | Admitting: Internal Medicine

## 2014-11-13 DIAGNOSIS — E0590 Thyrotoxicosis, unspecified without thyrotoxic crisis or storm: Secondary | ICD-10-CM | POA: Diagnosis not present

## 2014-11-13 MED ORDER — SODIUM IODIDE I 131 CAPSULE
7.0000 | Freq: Once | INTRAVENOUS | Status: AC | PRN
  Administered 2014-11-13: 7 via ORAL

## 2014-11-13 MED ORDER — SODIUM PERTECHNETATE TC 99M INJECTION
10.9000 | Freq: Once | INTRAVENOUS | Status: AC | PRN
  Administered 2014-11-13: 10.9 via INTRAVENOUS

## 2015-01-14 ENCOUNTER — Ambulatory Visit (INDEPENDENT_AMBULATORY_CARE_PROVIDER_SITE_OTHER): Payer: BLUE CROSS/BLUE SHIELD | Admitting: Internal Medicine

## 2015-01-14 ENCOUNTER — Encounter: Payer: Self-pay | Admitting: Internal Medicine

## 2015-01-14 VITALS — BP 112/72 | HR 78 | Ht 67.0 in | Wt 168.0 lb

## 2015-01-14 DIAGNOSIS — R05 Cough: Secondary | ICD-10-CM | POA: Diagnosis not present

## 2015-01-14 DIAGNOSIS — R059 Cough, unspecified: Secondary | ICD-10-CM

## 2015-01-14 NOTE — Assessment & Plan Note (Signed)
-   Allergy profile 06/25/2014 > no Eos, IgE 8 and neg RAST - singulair 10 mg qd 08/06/2014 >improved 09/17/2014  > resolved on just singulair   Not really clear whether cough variant asthma vs eos rhinitis or bronchitis but the cough is well controlled on generic singulair so no changes needed  I did remind Brenda Reed however: Explained the natural history of uri and why it's necessary in patients at risk to treat GERD aggressively - at least  short term -   to reduce risk of evolving cyclical cough initially  triggered by epithelial injury and a heightened sensitivty to the effects of any upper airway irritants,  most importantly acid - related - then perpetuated by epithelial injury related to the cough itself as the upper airway collapses on itself.  That is, the more sensitive the epithelium becomes once it is damaged by the virus, the more the ensuing irritability> the more the cough, the more the secondary reflux (especially in those prone to reflux) the more the irritation of the sensitive mucosa and so on in a  Classic cyclical pattern.

## 2015-01-14 NOTE — Patient Instructions (Signed)
Continue singulair indefinitely   At first the sign of a flare of the cough >  Try prilosec otc 20mg   Take 30-60 min before first meal of the day and Pepcid ac (famotidine) 20 mg one @  bedtime until cough is completely gone for at least a week without the need for cough suppression and remember  For drainage take chlortrimeton (chlorpheniramine) 4 mg every 4 hours available over the counter (may cause drowsiness)     If you are satisfied with your treatment plan,  let your doctor know and he/she can either refill your medications or you can return here when your prescription runs out.     If in any way you are not 100% satisfied,  please tell us.  If 100% better, tell your friends!  Pulmonary follow up is as needed

## 2015-01-14 NOTE — Progress Notes (Signed)
Subjective:     Patient ID: Brenda Reed, female   DOB: [birth year],   MRN: [id number]     Brief patient profile:  24 yobf from [place] in [place] since 2002 and with exp to wood fires for cooking and required an inhaler  in [place] for sob/cough but once let to [place] no inhaler then intermittent cough started up again 2013 referred by Brenda Rosser PA for cough  to pulmonary clinic 06/25/2014    History of Present Illness  06/25/2014 1st [facility] office visit/ Brenda Reed   Chief Complaint  Patient presents with  . Pulmonary Consult    Ref. by Brenda Guthrie MD-c/o cough comes and goes-dry x 2-3 yrs.,indigestion and heartburn occass., no PND,sob with cough, no wheezing, denies cp or tightness,no fcs,nasal congestion white,sneezing  persitent cough x 2 weeks after last flared in Oct 2015 and no treatment entire month of Nov,  Seems to go away on its own rather from any specific rx but is assoc with overt HB. Typically worse when lie down but  no better from ppi in am  For last 2 years sneezes during allergy season controlled with allegra d never tried it  for cough but does not think her upper airway seasonal symptoms correlate at all with the intermiitent cough  rec Prednisone 10 mg take  4 each am x 2 days,   2 each am x 2 days,  1 each am x 2 days and stop Pantoprazole (protonix) 40 mg   Take 30-60 min before first meal of the day and Pepcid 20 mg one bedtime until return to office  GERD  If night time cough not getting  better add chloretrimeton (chlorepheniramine) 4 mg 1-2 before bedtime     08/06/2014 f/u ov/Brenda Reed re: chronic /recurrent cough x 2013 / stopped gerd Rx prior to ov (not clear why but apparently started back up before she stopped it)  Chief Complaint  Patient presents with  . Follow-up    Pt states that her cough is some better. She is coughing less at night. No new co's today.   never took h1 at hs, thinks prednisone made the most difference and once stopped the cough  gradually came back, esp the daytime component.  Producing minimal mucoid sputum mostly with throat clearing   rec Add singulair (montelukast) 10 mg each pm Prednisone 10 mg Take 4 for two days three for two days two for two days one for two days  Pantoprazole (protonix) 40 mg   Take 30-60 min before first meal of the day and Pepcid 20 mg one bedtime until return to office -  GERD  Diet  add chloretrimeton (chlorepheniramine) 4 mg 1-2 before bedtime to see if helps the night time cough     09/17/2014 f/u ov/Brenda Reed re: cough x 2013  Chief Complaint  Patient presents with  . Follow-up    Pt states that her cough is some better. No new co's today.  did better on prednionse but this time no flare off it  While maint on singulair and h2h1 hs rec After this month Try off the chlorpheniramine to see what difference if any this makes  Continue singulair nightly   01/14/2015 f/u ov/Brenda Reed re: chronic cough ? Cough variant asthma   Chief Complaint  Patient presents with  . Follow-up    Pt states that her cough is some better. No new co's.   still sev times a weeks per weeks/ feels like a choking sensations x  1-2 m better with glass of water/ only maint on singulair and no gerd rx or antihistamines at this point   Not limited by breathing from desired activities    No obvious patterns when she notes the daytime only  cough  or assoc sob or cp or chest tightness, subjective wheeze.  No other  unusual exp hx or h/o childhood pna/ asthma or knowledge of premature birth.  Sleeping ok without nocturnal  or early am exacerbation  of respiratory  c/o's or need for noct saba. Also denies any obvious fluctuation of symptoms with weather or environmental changes or other aggravating or alleviating factors except as outlined above   Current Medications, Allergies, Complete Past Medical History, Past Surgical History, Family History, and Social History were reviewed in [employer]  record.  ROS  The following are not active complaints unless bolded sore throat, dysphagia, dental problems, itching, sneezing,  nasal congestion or excess/ purulent secretions, ear ache,   fever, chills, sweats, unintended wt loss, pleuritic or exertional cp, hemoptysis,  orthopnea pnd or leg swelling, presyncope, palpitations, heartburn, abdominal pain, anorexia, nausea, vomiting, diarrhea  or change in bowel or urinary habits, change in stools or urine, dysuria,hematuria,  rash, arthralgias, visual complaints, headache, numbness weakness or ataxia or problems with walking or coordination,  change in mood/affect or memory.               Objective:   Physical Exam   Pleasant amb bf nad in head scarf / no longer throat clearing   08/06/2014        165  > 09/17/2014    167   Wt Readings from Last 3 Encounters:  06/25/14 165 lb (74.844 kg)  06/08/14 162 lb 3.2 oz (73.573 kg)  07/25/13 156 lb 3.2 oz (70.852 kg)    Vital signs reviewed  HEENT: nl dentition, turbinates, and orophanx. Nl external ear canals without cough reflex   NECK :  without JVD/Nodes/TM/ nl carotid upstrokes bilaterally   LUNGS: no acc muscle use, clear to A and P bilaterally without cough on insp or exp maneuvers   CV:  RRR  no s3 or murmur or increase in P2, no edema   ABD:  soft and nontender with nl excursion in the supine position. No bruits or organomegaly, bowel sounds nl  MS:  warm without deformities, calf tenderness, cyanosis or clubbing  SKIN: warm and dry without lesions         CXR PA and Lateral:   06/25/2014 :  No active cardiopulmonary disease      Assessment:

## 2015-04-29 ENCOUNTER — Other Ambulatory Visit: Payer: Self-pay | Admitting: Women's Health

## 2015-07-20 ENCOUNTER — Other Ambulatory Visit: Payer: Self-pay | Admitting: Women's Health

## 2015-07-26 ENCOUNTER — Other Ambulatory Visit: Payer: Self-pay | Admitting: Internal Medicine

## 2015-08-20 ENCOUNTER — Encounter: Payer: Self-pay | Admitting: Women's Health

## 2015-08-20 ENCOUNTER — Other Ambulatory Visit (HOSPITAL_COMMUNITY)
Admission: RE | Admit: 2015-08-20 | Discharge: 2015-08-20 | Disposition: A | Discharge status: Home / Self Care | Payer: BLUE CROSS/BLUE SHIELD | Source: Ambulatory Visit | Attending: Women's Health | Admitting: Women's Health

## 2015-08-20 ENCOUNTER — Ambulatory Visit (INDEPENDENT_AMBULATORY_CARE_PROVIDER_SITE_OTHER): Payer: BLUE CROSS/BLUE SHIELD | Admitting: Women's Health

## 2015-08-20 VITALS — BP 122/84 | Ht 68.0 in | Wt 159.8 lb

## 2015-08-20 DIAGNOSIS — B373 Candidiasis of vulva and vagina: Secondary | ICD-10-CM | POA: Diagnosis not present

## 2015-08-20 DIAGNOSIS — Z3041 Encounter for surveillance of contraceptive pills: Secondary | ICD-10-CM

## 2015-08-20 DIAGNOSIS — Z1151 Encounter for screening for human papillomavirus (HPV): Secondary | ICD-10-CM | POA: Insufficient documentation

## 2015-08-20 DIAGNOSIS — Z13220 Encounter for screening for lipoid disorders: Secondary | ICD-10-CM

## 2015-08-20 DIAGNOSIS — N920 Excessive and frequent menstruation with regular cycle: Secondary | ICD-10-CM

## 2015-08-20 DIAGNOSIS — Z01419 Encounter for gynecological examination (general) (routine) without abnormal findings: Secondary | ICD-10-CM | POA: Diagnosis not present

## 2015-08-20 DIAGNOSIS — B3731 Acute candidiasis of vulva and vagina: Secondary | ICD-10-CM

## 2015-08-20 DIAGNOSIS — Z01411 Encounter for gynecological examination (general) (routine) with abnormal findings: Secondary | ICD-10-CM | POA: Insufficient documentation

## 2015-08-20 LAB — CBC WITH DIFFERENTIAL/PLATELET
BASOS ABS: 0.1 10*3/uL (ref 0.0–0.1)
Basophils Relative: 1 % (ref 0–1)
EOS ABS: 0.1 10*3/uL (ref 0.0–0.7)
EOS PCT: 2 % (ref 0–5)
HCT: 39.8 % (ref 36.0–46.0)
Hemoglobin: 12.3 g/dL (ref 12.0–15.0)
LYMPHS ABS: 2.1 10*3/uL (ref 0.7–4.0)
Lymphocytes Relative: 38 % (ref 12–46)
MCH: 23.1 pg — AB (ref 26.0–34.0)
MCHC: 30.9 g/dL (ref 30.0–36.0)
MCV: 74.8 fL — AB (ref 78.0–100.0)
MONO ABS: 0.5 10*3/uL (ref 0.1–1.0)
MPV: 11.5 fL (ref 8.6–12.4)
Monocytes Relative: 9 % (ref 3–12)
Neutro Abs: 2.8 10*3/uL (ref 1.7–7.7)
Neutrophils Relative %: 50 % (ref 43–77)
PLATELETS: 225 10*3/uL (ref 150–400)
RBC: 5.32 MIL/uL — ABNORMAL HIGH (ref 3.87–5.11)
RDW: 15.8 % — AB (ref 11.5–15.5)
WBC: 5.6 10*3/uL (ref 4.0–10.5)

## 2015-08-20 LAB — WET PREP FOR TRICH, YEAST, CLUE
CLUE CELLS WET PREP: NONE SEEN
TRICH WET PREP: NONE SEEN

## 2015-08-20 LAB — LIPID PANEL
Cholesterol: 156 mg/dL (ref 125–200)
HDL: 67 mg/dL (ref >=46)
LDL CALC: 75 mg/dL (ref <130)
TRIGLYCERIDES: 69 mg/dL (ref <150)
Total CHOL/HDL Ratio: 2.3 Ratio (ref <=5.0)
VLDL: 14 mg/dL (ref <30)

## 2015-08-20 LAB — TSH

## 2015-08-20 LAB — GLUCOSE, RANDOM: Glucose, Bld: 99 mg/dL (ref 65–99)

## 2015-08-20 MED ORDER — FLUCONAZOLE 150 MG PO TABS: 150.0000 mg | ORAL_TABLET | Freq: Once | ORAL | Status: DC

## 2015-08-20 MED ORDER — NORETHIN ACE-ETH ESTRAD-FE 1-20 MG-MCG PO TABS: 1.0000 | ORAL_TABLET | Freq: Every day | ORAL | Status: DC

## 2015-08-20 NOTE — Progress Notes (Signed)
Brenda Reed [birth year] [id number]    History:    Presents for annual exam.  Reports heavy bleeding during monthly cycles lasting 7 days.  Lutera for one year with some relief of menorrhagia. States she gets very anxious during her menses and feels weak. Reports vaginal itching.  Normal PAP and Mammogram history. History of hyperthyroidism within normal thyroid scan.  Past medical history, past surgical history, family history and social history were all reviewed and documented in the EPIC chart.  Married, sons 53 and 49 doing well.  Works for [employer] on [employer].  ROS:  A ROS was performed and pertinent positives and negatives are included.  Exam:  Filed Vitals:   08/20/15 1001  BP: 122/84    General appearance:  Normal Thyroid:  Symmetrical, normal in size, without palpable masses or nodularity. Respiratory  Auscultation:  Clear without wheezing or rhonchi Cardiovascular  Auscultation:  Regular rate, without rubs, murmurs or gallops  Edema/varicosities:  Not grossly evident Abdominal  Soft,nontender, without masses, guarding or rebound.  Liver/spleen:  No organomegaly noted  Hernia:  None appreciated  Skin  Inspection:  Grossly normal   Breasts: Examined lying and sitting.     Right: Without masses, retractions, discharge or axillary adenopathy.     Left: Without masses, retractions, discharge or axillary adenopathy. Gentitourinary   Inguinal/mons:  Normal without inguinal adenopathy  External genitalia:  Normal  BUS/Urethra/Skene's glands:  Normal  Vagina:  Normal   Cervix:  Erythema noted with scant amount of whitish discharge   Uterus:  normal in size, shape and contour.  Midline and mobile  Adnexa/parametria:     Rt: Without masses or tenderness.   Lt: Without masses or tenderness.  Anus and perineum: Normal  Digital rectal exam: Normal sphincter tone without palpated masses or tenderness  Assessment/Plan:  45 y.o. MBF G2 P2  for annual  exam with complaint of heavy bleeding with monthly cycles.  Regular monthly cycles/heavy bleeding/Lutera History of hyperthyroidism normal thyroid scan on no medication Yeast vaginitis  Plan:  Prescription for Lutera, slight risk for blood clots and strokes reviewed, continue taking daily. Schedule ultrasound,. Diflucan 150 by mouth 1 dose. SBE's, continue annual 3-D screening mammogram history of dense breasts. Continue active lifestyle, regular exercise, vitamin D 1000 daily encouraged. CBC, TSH, lipid panel, glucose, UA, Pap with HR HPV typing.    [place], 10:50 AM 08/20/2015

## 2015-08-20 NOTE — Patient Instructions (Signed)
Health Maintenance, Female Adopting a healthy lifestyle and getting preventive care can go a long way to promote health and wellness. Talk with your health care provider about what schedule of regular examinations is right for you. This is a good chance for you to check in with your provider about disease prevention and staying healthy. In between checkups, there are plenty of things you can do on your own. Experts have done a lot of research about which lifestyle changes and preventive measures are most likely to keep you healthy. Ask your health care provider for more information. WEIGHT AND DIET  Eat a healthy diet  Be sure to include plenty of vegetables, fruits, low-fat dairy products, and lean protein.  Do not eat a lot of foods high in solid fats, added sugars, or salt.  Get regular exercise. This is one of the most important things you can do for your health.  Most adults should exercise for at least 150 minutes each week. The exercise should increase your heart rate and make you sweat (moderate-intensity exercise).  Most adults should also do strengthening exercises at least twice a week. This is in addition to the moderate-intensity exercise.  Maintain a healthy weight  Body mass index (BMI) is a measurement that can be used to identify possible weight problems. It estimates body fat based on height and weight. Your health care provider can help determine your BMI and help you achieve or maintain a healthy weight.  For females 20 years of age and older:   A BMI below 18.5 is considered underweight.  A BMI of 18.5 to 24.9 is normal.  A BMI of 25 to 29.9 is considered overweight.  A BMI of 30 and above is considered obese.  Watch levels of cholesterol and blood lipids  You should start having your blood tested for lipids and cholesterol at 43 years of age, then have this test every 5 years.  You may need to have your cholesterol levels checked more often if:  Your lipid  or cholesterol levels are high.  You are older than 43 years of age.  You are at high risk for heart disease.  CANCER SCREENING   Lung Cancer  Lung cancer screening is recommended for adults 55-80 years old who are at high risk for lung cancer because of a history of smoking.  A yearly low-dose CT scan of the lungs is recommended for people who:  Currently smoke.  Have quit within the past 15 years.  Have at least a 30-pack-year history of smoking. A pack year is smoking an average of one pack of cigarettes a day for 1 year.  Yearly screening should continue until it has been 15 years since you quit.  Yearly screening should stop if you develop a health problem that would prevent you from having lung cancer treatment.  Breast Cancer  Practice breast self-awareness. This means understanding how your breasts normally appear and feel.  It also means doing regular breast self-exams. Let your health care provider know about any changes, no matter how small.  If you are in your 20s or 30s, you should have a clinical breast exam (CBE) by a health care provider every 1-3 years as part of a regular health exam.  If you are 40 or older, have a CBE every year. Also consider having a breast X-ray (mammogram) every year.  If you have a family history of breast cancer, talk to your health care provider about genetic screening.  If you   are at high risk for breast cancer, talk to your health care provider about having an MRI and a mammogram every year.  Breast cancer gene (BRCA) assessment is recommended for women who have family members with BRCA-related cancers. BRCA-related cancers include:  Breast.  Ovarian.  Tubal.  Peritoneal cancers.  Results of the assessment will determine the need for genetic counseling and BRCA1 and BRCA2 testing. Cervical Cancer Your health care provider may recommend that you be screened regularly for cancer of the pelvic organs (ovaries, uterus, and  vagina). This screening involves a pelvic examination, including checking for microscopic changes to the surface of your cervix (Pap test). You may be encouraged to have this screening done every 3 years, beginning at age 21.  For women ages 30-65, health care providers may recommend pelvic exams and Pap testing every 3 years, or they may recommend the Pap and pelvic exam, combined with testing for human papilloma virus (HPV), every 5 years. Some types of HPV increase your risk of cervical cancer. Testing for HPV may also be done on women of any age with unclear Pap test results.  Other health care providers may not recommend any screening for nonpregnant women who are considered low risk for pelvic cancer and who do not have symptoms. Ask your health care provider if a screening pelvic exam is right for you.  If you have had past treatment for cervical cancer or a condition that could lead to cancer, you need Pap tests and screening for cancer for at least 20 years after your treatment. If Pap tests have been discontinued, your risk factors (such as having a new sexual partner) need to be reassessed to determine if screening should resume. Some women have medical problems that increase the chance of getting cervical cancer. In these cases, your health care provider may recommend more frequent screening and Pap tests. Colorectal Cancer  This type of cancer can be detected and often prevented.  Routine colorectal cancer screening usually begins at 43 years of age and continues through 43 years of age.  Your health care provider may recommend screening at an earlier age if you have risk factors for colon cancer.  Your health care provider may also recommend using home test kits to check for hidden blood in the stool.  A small camera at the end of a tube can be used to examine your colon directly (sigmoidoscopy or colonoscopy). This is done to check for the earliest forms of colorectal  cancer.  Routine screening usually begins at age 50.  Direct examination of the colon should be repeated every 5-10 years through 43 years of age. However, you may need to be screened more often if early forms of precancerous polyps or small growths are found. Skin Cancer  Check your skin from head to toe regularly.  Tell your health care provider about any new moles or changes in moles, especially if there is a change in a mole's shape or color.  Also tell your health care provider if you have a mole that is larger than the size of a pencil eraser.  Always use sunscreen. Apply sunscreen liberally and repeatedly throughout the day.  Protect yourself by wearing long sleeves, pants, a wide-brimmed hat, and sunglasses whenever you are outside. HEART DISEASE, DIABETES, AND HIGH BLOOD PRESSURE   High blood pressure causes heart disease and increases the risk of stroke. High blood pressure is more likely to develop in:  People who have blood pressure in the high end   of the normal range (130-139/85-89 mm Hg).  People who are overweight or obese.  People who are African American.  If you are 38-23 years of age, have your blood pressure checked every 3-5 years. If you are 61 years of age or older, have your blood pressure checked every year. You should have your blood pressure measured twice--once when you are at a hospital or clinic, and once when you are not at a hospital or clinic. Record the average of the two measurements. To check your blood pressure when you are not at a hospital or clinic, you can use:  An automated blood pressure machine at a pharmacy.  A home blood pressure monitor.  If you are between 45 years and 39 years old, ask your health care provider if you should take aspirin to prevent strokes.  Have regular diabetes screenings. This involves taking a blood sample to check your fasting blood sugar level.  If you are at a normal weight and have a low risk for diabetes,  have this test once every three years after 43 years of age.  If you are overweight and have a high risk for diabetes, consider being tested at a younger age or more often. PREVENTING INFECTION  Hepatitis B  If you have a higher risk for hepatitis B, you should be screened for this virus. You are considered at high risk for hepatitis B if:  You were born in a country where hepatitis B is common. Ask your health care provider which countries are considered high risk.  Your parents were born in a high-risk country, and you have not been immunized against hepatitis B (hepatitis B vaccine).  You have HIV or AIDS.  You use needles to inject street drugs.  You live with someone who has hepatitis B.  You have had sex with someone who has hepatitis B.  You get hemodialysis treatment.  You take certain medicines for conditions, including cancer, organ transplantation, and autoimmune conditions. Hepatitis C  Blood testing is recommended for:  Everyone born from [birth year] through 1963.  Anyone with known risk factors for hepatitis C. Sexually transmitted infections (STIs)  You should be screened for sexually transmitted infections (STIs) including gonorrhea and chlamydia if:  You are sexually active and are younger than 43 years of age.  You are older than 43 years of age and your health care provider tells you that you are at risk for this type of infection.  Your sexual activity has changed since you were last screened and you are at an increased risk for chlamydia or gonorrhea. Ask your health care provider if you are at risk.  If you do not have HIV, but are at risk, it may be recommended that you take a prescription medicine daily to prevent HIV infection. This is called pre-exposure prophylaxis (PrEP). You are considered at risk if:  You are sexually active and do not regularly use condoms or know the HIV status of your partner(s).  You take drugs by injection.  You are sexually  active with a partner who has HIV. Talk with your health care provider about whether you are at high risk of being infected with HIV. If you choose to begin PrEP, you should first be tested for HIV. You should then be tested every 3 months for as long as you are taking PrEP.  PREGNANCY   If you are premenopausal and you may become pregnant, ask your health care provider about preconception counseling.  If you may  become pregnant, take 400 to 800 micrograms (mcg) of folic acid every day.  If you want to prevent pregnancy, talk to your health care provider about birth control (contraception). OSTEOPOROSIS AND MENOPAUSE   Osteoporosis is a disease in which the bones lose minerals and strength with aging. This can result in serious bone fractures. Your risk for osteoporosis can be identified using a bone density scan.  If you are 66 years of age or older, or if you are at risk for osteoporosis and fractures, ask your health care provider if you should be screened.  Ask your health care provider whether you should take a calcium or vitamin D supplement to lower your risk for osteoporosis.  Menopause may have certain physical symptoms and risks.  Hormone replacement therapy may reduce some of these symptoms and risks. Talk to your health care provider about whether hormone replacement therapy is right for you.  HOME CARE INSTRUCTIONS   Schedule regular health, dental, and eye exams.  Stay current with your immunizations.   Do not use any tobacco products including cigarettes, chewing tobacco, or electronic cigarettes.  If you are pregnant, do not drink alcohol.  If you are breastfeeding, limit how much and how often you drink alcohol.  Limit alcohol intake to no more than 1 drink per day for nonpregnant women. One drink equals 12 ounces of beer, 5 ounces of wine, or 1 ounces of hard liquor.  Do not use street drugs.  Do not share needles.  Ask your health care provider for help if  you need support or information about quitting drugs.  Tell your health care provider if you often feel depressed.  Tell your health care provider if you have ever been abused or do not feel safe at home.   This information is not intended to replace advice given to you by your health care provider. Make sure you discuss any questions you have with your health care provider.   Document Released: 01/09/2011 Document Revised: 07/17/2014 Document Reviewed: 05/28/2013 Elsevier Interactive Patient Education [employer].

## 2015-08-21 LAB — URINALYSIS W MICROSCOPIC + REFLEX CULTURE
BACTERIA UA: NONE SEEN [HPF]
Bilirubin Urine: NEGATIVE
CASTS: NONE SEEN [LPF]
Crystals: NONE SEEN [HPF]
GLUCOSE, UA: NEGATIVE
HGB URINE DIPSTICK: NEGATIVE
KETONES UR: NEGATIVE
LEUKOCYTES UA: NEGATIVE
NITRITE: NEGATIVE
PH: 5.5 (ref 5.0–8.0)
RBC / HPF: NONE SEEN RBC/HPF (ref <=2)
SQUAMOUS EPITHELIAL / LPF: NONE SEEN [HPF] (ref <=5)
Specific Gravity, Urine: 1.028 (ref 1.001–1.035)
WBC, UA: NONE SEEN WBC/HPF (ref <=5)
Yeast: NONE SEEN [HPF]

## 2015-08-24 LAB — CYTOLOGY - PAP

## 2015-09-16 ENCOUNTER — Other Ambulatory Visit: Payer: Self-pay | Admitting: Women's Health

## 2015-09-16 ENCOUNTER — Encounter: Payer: Self-pay | Admitting: Women's Health

## 2015-09-16 ENCOUNTER — Ambulatory Visit (INDEPENDENT_AMBULATORY_CARE_PROVIDER_SITE_OTHER): Payer: BLUE CROSS/BLUE SHIELD

## 2015-09-16 ENCOUNTER — Ambulatory Visit (INDEPENDENT_AMBULATORY_CARE_PROVIDER_SITE_OTHER): Payer: BLUE CROSS/BLUE SHIELD | Admitting: Women's Health

## 2015-09-16 DIAGNOSIS — D259 Leiomyoma of uterus, unspecified: Secondary | ICD-10-CM

## 2015-09-16 DIAGNOSIS — D251 Intramural leiomyoma of uterus: Secondary | ICD-10-CM

## 2015-09-16 DIAGNOSIS — N920 Excessive and frequent menstruation with regular cycle: Secondary | ICD-10-CM | POA: Diagnosis not present

## 2015-09-16 DIAGNOSIS — D252 Subserosal leiomyoma of uterus: Secondary | ICD-10-CM

## 2015-09-16 NOTE — Patient Instructions (Addendum)
Levonorgestrel intrauterine device (IUD) What is this medicine? LEVONORGESTREL IUD (LEE voe nor jes trel) is a contraceptive (birth control) device. The device is placed inside the uterus by a healthcare professional. It is used to prevent pregnancy and can also be used to treat heavy bleeding that occurs during your period. Depending on the device, it can be used for 3 to 5 years. This medicine may be used for other purposes; ask your health care provider or pharmacist if you have questions. What should I tell my health care provider before I take this medicine? They need to know if you have any of these conditions: -abnormal Pap smear -cancer of the breast, uterus, or cervix -diabetes -endometritis -genital or pelvic infection now or in the past -have more than one sexual partner or your partner has more than one partner -heart disease -history of an ectopic or tubal pregnancy -immune system problems -IUD in place -liver disease or tumor -problems with blood clots or take blood-thinners -use intravenous drugs -uterus of unusual shape -vaginal bleeding that has not been explained -an unusual or allergic reaction to levonorgestrel, other hormones, silicone, or polyethylene, medicines, foods, dyes, or preservatives -pregnant or trying to get pregnant -breast-feeding How should I use this medicine? This device is placed inside the uterus by a health care professional. Talk to your pediatrician regarding the use of this medicine in children. Special care may be needed. Overdosage: If you think you have taken too much of this medicine contact a poison control center or emergency room at once. NOTE: This medicine is only for you. Do not share this medicine with others. What if I miss a dose? This does not apply. What may interact with this medicine? Do not take this medicine with any of the following medications: -amprenavir -bosentan -fosamprenavir This medicine may also interact with  the following medications: -aprepitant -barbiturate medicines for inducing sleep or treating seizures -bexarotene -griseofulvin -medicines to treat seizures like carbamazepine, ethotoin, felbamate, oxcarbazepine, phenytoin, topiramate -modafinil -pioglitazone -rifabutin -rifampin -rifapentine -some medicines to treat HIV infection like atazanavir, indinavir, lopinavir, nelfinavir, tipranavir, ritonavir -St. John's wort -warfarin This list may not describe all possible interactions. Give your health care provider a list of all the medicines, herbs, non-prescription drugs, or dietary supplements you use. Also tell them if you smoke, drink alcohol, or use illegal drugs. Some items may interact with your medicine. What should I watch for while using this medicine? Visit your doctor or health care professional for regular check ups. See your doctor if you or your partner has sexual contact with others, becomes HIV positive, or gets a sexual transmitted disease. This product does not protect you against HIV infection (AIDS) or other sexually transmitted diseases. You can check the placement of the IUD yourself by reaching up to the top of your vagina with clean fingers to feel the threads. Do not pull on the threads. It is a good habit to check placement after each menstrual period. Call your doctor right away if you feel more of the IUD than just the threads or if you cannot feel the threads at all. The IUD may come out by itself. You may become pregnant if the device comes out. If you notice that the IUD has come out use a backup birth control method like condoms and call your health care provider. Using tampons will not change the position of the IUD and are okay to use during your period. What side effects may I notice from receiving this medicine?   Side effects that you should report to your doctor or health care professional as soon as possible: -allergic reactions like skin rash, itching or  hives, swelling of the face, lips, or tongue -fever, flu-like symptoms -genital sores -high blood pressure -no menstrual period for 6 weeks during use -pain, swelling, warmth in the leg -pelvic pain or tenderness -severe or sudden headache -signs of pregnancy -stomach cramping -sudden shortness of breath -trouble with balance, talking, or walking -unusual vaginal bleeding, discharge -yellowing of the eyes or skin Side effects that usually do not require medical attention (report to your doctor or health care professional if they continue or are bothersome): -acne -breast pain -change in sex drive or performance -changes in weight -cramping, dizziness, or faintness while the device is being inserted -headache -irregular menstrual bleeding within first 3 to 6 months of use -nausea This list may not describe all possible side effects. Call your doctor for medical advice about side effects. You may report side effects to FDA at 1-800-FDA-1088. Where should I keep my medicine? This does not apply. NOTE: This sheet is a summary. It may not cover all possible information. If you have questions about this medicine, talk to your doctor, pharmacist, or health care provider.    2016, Elsevier/Gold Standard. (2011-07-27 13:54:04) Endometrial Ablation Endometrial ablation removes the lining of the uterus (endometrium). It is usually a same-day, outpatient treatment. Ablation helps avoid major surgery, such as surgery to remove the cervix and uterus (hysterectomy). After endometrial ablation, you will have little or no menstrual bleeding and may not be able to have children. However, if you are premenopausal, you will need to use a reliable method of birth control following the procedure because of the small chance that pregnancy can occur. There are different reasons to have this procedure. These reasons include:  Heavy periods.  Bleeding that is causing anemia.  Irregular  bleeding.  Bleeding fibroids on the lining inside the uterus if they are smaller than 3 centimeters. This procedure may not be possible for you if:   You want to have children in the future.   You have severe cramps with your menstrual period.   You have precancerous or cancerous cells in your uterus.   You were recently pregnant.   You have gone through menopause.   You have had major surgery on your uterus, resulting in thinning of the uterine wall. Surgeries may include:  The removal of one or more uterine fibroids (myomectomy).  A cesarean section with a classic (vertical) incision on your uterus. Ask your health care provider what type of cesarean you had. Sometimes the scar on your skin is different than the scar on your uterus. Even if you have had surgery on your uterus, certain types of ablation may still be safe for you. Talk with your health care provider. LET [facility] CARE PROVIDER KNOW ABOUT:  Any allergies you have.  All medicines you are taking, including vitamins, herbs, eye drops, creams, and over-the-counter medicines.  Previous problems you or members of your family have had with the use of anesthetics.  Any blood disorders you have.  Previous surgeries you have had.  Medical conditions you have. RISKS AND COMPLICATIONS  Generally, this is a safe procedure. However, as with any procedure, complications can occur. Possible complications include:  Perforation of the uterus.  Bleeding.  Infection of the uterus, bladder, or vagina.  Injury to surrounding organs.  An air bubble to the lung (air embolus).  Pregnancy following the procedure.  Failure of the procedure to help the problem, requiring hysterectomy.  Decreased ability to diagnose cancer in the lining of the uterus. BEFORE THE PROCEDURE  The lining of the uterus must be tested to make sure there is no pre-cancerous or cancer cells present.  An ultrasound may be performed to look  at the size of the uterus and to check for abnormalities.  Medicines may be given to thin the lining of the uterus. PROCEDURE  During the procedure, your health care provider will use a tool called a resectoscope to help see inside your uterus. There are different ways to remove the lining of your uterus.   Radiofrequency - This method uses a radiofrequency-alternating electric current to remove the lining of the uterus.  Cryotherapy - This method uses extreme cold to freeze the lining of the uterus.  Heated-Free Liquid - This method uses heated salt (saline) solution to remove the lining of the uterus.  Microwave - This method uses high-energy microwaves to heat up the lining of the uterus to remove it.  Thermal balloon - This method involves inserting a catheter with a balloon tip into the uterus. The balloon tip is filled with heated fluid to remove the lining of the uterus. AFTER THE PROCEDURE  After your procedure, do not have sexual intercourse or insert anything into your vagina until permitted by your health care provider. After the procedure, you may experience:  Cramps.  Vaginal discharge.  Frequent urination.   This information is not intended to replace advice given to you by your health care provider. Make sure you discuss any questions you have with your health care provider.   Document Released: 05/05/2004 Document Revised: 03/17/2015 Document Reviewed: 11/27/2012 Elsevier Interactive Patient Education 2016 Elsevier Inc. Uterine Fibroids Uterine fibroids are tissue masses (tumors) that can develop in the womb (uterus). They are also called leiomyomas. This type of tumor is not cancerous (benign) and does not spread to other parts of the body outside of the pelvic area, which is between the hip bones. Occasionally, fibroids may develop in the fallopian tubes, in the cervix, or on the support structures (ligaments) that surround the uterus. You can have one or many  fibroids. Fibroids can vary in size, weight, and where they grow in the uterus. Some can become quite large. Most fibroids do not require medical treatment. CAUSES A fibroid can develop when a single uterine cell keeps growing (replicating). Most cells in the human body have a control mechanism that keeps them from replicating without control. SIGNS AND SYMPTOMS Symptoms may include:   Heavy bleeding during your period.  Bleeding or spotting between periods.  Pelvic pain and pressure.  Bladder problems, such as needing to urinate more often (urinary frequency) or urgently.  Inability to reproduce offspring (infertility).  Miscarriages. DIAGNOSIS Uterine fibroids are diagnosed through a physical exam. Your health care provider may feel the lumpy tumors during a pelvic exam. Ultrasonography and an MRI may be done to determine the size, location, and number of fibroids. TREATMENT Treatment may include:  Watchful waiting. This involves getting the fibroid checked by your health care provider to see if it grows or shrinks. Follow your health care provider's recommendations for how often to have this checked.  Hormone medicines. These can be taken by mouth or given through an intrauterine device (IUD).  Surgery.  Removing the fibroids (myomectomy) or the uterus (hysterectomy).  Removing blood supply to the fibroids (uterine artery embolization). If fibroids interfere with your fertility and you  want to become pregnant, your health care provider may recommend having the fibroids removed.  HOME CARE INSTRUCTIONS  Keep all follow-up visits as directed by your health care provider. This is important.  Take medicines only as directed by your health care provider.  If you were prescribed a hormone treatment, take the hormone medicines exactly as directed.  Do not take aspirin, because it can cause bleeding.  Ask your health care provider about taking iron pills and increasing the amount  of dark green, leafy vegetables in your diet. These actions can help to boost your blood iron levels, which may be affected by heavy menstrual bleeding.  Pay close attention to your period and tell your health care provider about any changes, such as:  Increased blood flow that requires you to use more pads or tampons than usual per month.  A change in the number of days that your period lasts per month.  A change in symptoms that are associated with your period, such as abdominal cramping or back pain. SEEK MEDICAL CARE IF:  You have pelvic pain, back pain, or abdominal cramps that cannot be controlled with medicines.  You have an increase in bleeding between and during periods.  You soak tampons or pads in a half hour or less.  You feel lightheaded, extra tired, or weak. SEEK IMMEDIATE MEDICAL CARE IF:  You faint.  You have a sudden increase in pelvic pain.   This information is not intended to replace advice given to you by your health care provider. Make sure you discuss any questions you have with your health care provider.   Document Released: 06/23/2000 Document Revised: 07/17/2014 Document Reviewed: 12/23/2013 Elsevier Interactive Patient Education [employer].

## 2015-09-16 NOTE — Progress Notes (Signed)
Patient ID: Brenda Reed, female   DOB: 04-Dec-1970, 45 y.o.   MRN: [id number] Presents for ultrasound. At annual exam reports heavy menstrual cycle on Loestrin. Has been on Loestrin for 1 year with minimal relief of menorrhagia. Most months cycles last 7 days 3-4 which are heavy. No bleeding between cycles.  Ultrasound: T/V anteverted uterus with intramural and subserous fibroids posterior 16 x 16 mm displaces endometrium, and 15 x 10 mm. Right subserous 18 x 16 mm. Right and left ovary normal. Negative CDS.  Fibroid uterus with menorrhagia  Plan: Is planning to try Loestrin 24 FE prescription, given, reviewed slight risk for blood clots and strokes instructed to call if no relief. Reviewed possible ablation, Mirena IUD, declined will continue with OCs.

## 2015-12-28 ENCOUNTER — Other Ambulatory Visit: Payer: Self-pay | Admitting: Women's Health

## 2015-12-28 DIAGNOSIS — Z1231 Encounter for screening mammogram for malignant neoplasm of breast: Secondary | ICD-10-CM

## 2016-01-13 ENCOUNTER — Ambulatory Visit
Admission: RE | Admit: 2016-01-13 | Discharge: 2016-01-13 | Disposition: A | Discharge status: Home / Self Care | Payer: BLUE CROSS/BLUE SHIELD | Source: Ambulatory Visit | Attending: Women's Health | Admitting: Women's Health

## 2016-01-13 DIAGNOSIS — Z1231 Encounter for screening mammogram for malignant neoplasm of breast: Secondary | ICD-10-CM

## 2016-01-17 ENCOUNTER — Encounter: Payer: Self-pay | Admitting: Women's Health

## 2016-11-01 ENCOUNTER — Other Ambulatory Visit: Payer: Self-pay | Admitting: Women's Health

## 2016-11-01 DIAGNOSIS — Z3041 Encounter for surveillance of contraceptive pills: Secondary | ICD-10-CM

## 2017-03-07 ENCOUNTER — Ambulatory Visit (INDEPENDENT_AMBULATORY_CARE_PROVIDER_SITE_OTHER): Payer: BLUE CROSS/BLUE SHIELD | Admitting: Women's Health

## 2017-03-07 ENCOUNTER — Encounter: Payer: Self-pay | Admitting: Women's Health

## 2017-03-07 VITALS — BP 122/80 | Ht 68.0 in | Wt 174.0 lb

## 2017-03-07 DIAGNOSIS — Z3041 Encounter for surveillance of contraceptive pills: Secondary | ICD-10-CM

## 2017-03-07 DIAGNOSIS — E038 Other specified hypothyroidism: Secondary | ICD-10-CM | POA: Diagnosis not present

## 2017-03-07 DIAGNOSIS — Z01419 Encounter for gynecological examination (general) (routine) without abnormal findings: Secondary | ICD-10-CM

## 2017-03-07 LAB — CBC WITH DIFFERENTIAL/PLATELET
BASOS PCT: 1 %
Basophils Absolute: 55 cells/uL (ref 0–200)
EOS ABS: 110 {cells}/uL (ref 15–500)
Eosinophils Relative: 2 %
HEMATOCRIT: 37.4 % (ref 35.0–45.0)
Hemoglobin: 12.1 g/dL (ref 11.7–15.5)
Lymphocytes Relative: 38 %
Lymphs Abs: 2090 cells/uL (ref 850–3900)
MCH: 25 pg — ABNORMAL LOW (ref 27.0–33.0)
MCHC: 32.4 g/dL (ref 32.0–36.0)
MCV: 77.3 fL — AB (ref 80.0–100.0)
MONO ABS: 605 {cells}/uL (ref 200–950)
MPV: 10.8 fL (ref 7.5–12.5)
Monocytes Relative: 11 %
NEUTROS ABS: 2640 {cells}/uL (ref 1500–7800)
Neutrophils Relative %: 48 %
PLATELETS: 222 10*3/uL (ref 140–400)
RBC: 4.84 MIL/uL (ref 3.80–5.10)
RDW: 14.3 % (ref 11.0–15.0)
WBC: 5.5 10*3/uL (ref 3.8–10.8)

## 2017-03-07 MED ORDER — NORETHIN ACE-ETH ESTRAD-FE 1-20 MG-MCG PO TABS: 1.0000 | ORAL_TABLET | Freq: Every day | ORAL | 4 refills | Status: DC

## 2017-03-07 NOTE — Progress Notes (Signed)
Brenda Reed 09-Jun-1971 [id number]    History:    Presents for annual exam.  Light monthly cycle on Loestrin with good relief of menorrhagia. History of 3 small fibroids. Subclinical Hyperthyroidism with normal thyroid scan, has seen Dr. Cruzita Lederer. Normal Pap and mammogram history.  Past medical history, past surgical history, family history and social history were all reviewed and documented in the EPIC chart. Works at [employer], active job. Sons ages 27 and 68 both doing well.  ROS:  A ROS was performed and pertinent positives and negatives are included.  Exam:  Vitals:   03/07/17 1509  BP: 122/80  Weight: 174 lb (78.9 kg)  Height: 5\' 8"  (1.727 m)   Body mass index is 26.46 kg/m.   General appearance:  Normal Thyroid:  Symmetrical, normal in size, without palpable masses or nodularity. Respiratory  Auscultation:  Clear without wheezing or rhonchi Cardiovascular  Auscultation:  Regular rate, without rubs, murmurs or gallops  Edema/varicosities:  Not grossly evident Abdominal  Soft,nontender, without masses, guarding or rebound.  Liver/spleen:  No organomegaly noted  Hernia:  None appreciated  Skin  Inspection:  Grossly normal   Breasts: Examined lying and sitting.     Right: Without masses, retractions, discharge or axillary adenopathy.     Left: Without masses, retractions, discharge or axillary adenopathy. Gentitourinary   Inguinal/mons:  Normal without inguinal adenopathy  External genitalia:  Normal  BUS/Urethra/Skene's glands:  Normal  Vagina:  Normal  Cervix:  Normal  Uterus:   normal in size, shape and contour.  Midline and mobile  Adnexa/parametria:     Rt: Without masses or tenderness.   Lt: Without masses or tenderness.  Anus and perineum: Normal  Digital rectal exam: Normal sphincter tone without palpated masses or tenderness  Assessment/Plan:  46 y.o. MBF G2 P2  for annual exam with no complaints.  Light monthly cycle on Loestrin Fibroid  uterus Subclinical Hyperthyroidism-normal thyroid scan  Plan: Loestrin 1/20 prescription, proper use given and reviewed slight risk for blood clots and strokes. SBE's, continue annual screening mammogram due, instructed to schedule. Exercise, calcium rich diet, vitamin D 1000 daily encouraged. CBC, TSH, CMP, Pap normal 2017, new screening guidelines reviewed. Instructed to come  fasting next year will check a lipid panel.[name] [facility], 4:49 PM 03/07/2017

## 2017-03-07 NOTE — Patient Instructions (Signed)
[place] nuts 3 a day helps with tyroid function Health Maintenance, Female Adopting a healthy lifestyle and getting preventive care can go a long way to promote health and wellness. Talk with your health care provider about what schedule of regular examinations is right for you. This is a good chance for you to check in with your provider about disease prevention and staying healthy. In between checkups, there are plenty of things you can do on your own. Experts have done a lot of research about which lifestyle changes and preventive measures are most likely to keep you healthy. Ask your health care provider for more information. Weight and diet Eat a healthy diet  Be sure to include plenty of vegetables, fruits, low-fat dairy products, and lean protein.  Do not eat a lot of foods high in solid fats, added sugars, or salt.  Get regular exercise. This is one of the most important things you can do for your health. ? Most adults should exercise for at least 150 minutes each week. The exercise should increase your heart rate and make you sweat (moderate-intensity exercise). ? Most adults should also do strengthening exercises at least twice a week. This is in addition to the moderate-intensity exercise.  Maintain a healthy weight  Body mass index (BMI) is a measurement that can be used to identify possible weight problems. It estimates body fat based on height and weight. Your health care provider can help determine your BMI and help you achieve or maintain a healthy weight.  For females 50 years of age and older: ? A BMI below 18.5 is considered underweight. ? A BMI of 18.5 to 24.9 is normal. ? A BMI of 25 to 29.9 is considered overweight. ? A BMI of 30 and above is considered obese.  Watch levels of cholesterol and blood lipids  You should start having your blood tested for lipids and cholesterol at 44 years of age, then have this test every 5 years.  You may need to have your cholesterol  levels checked more often if: ? Your lipid or cholesterol levels are high. ? You are older than 44 years of age. ? You are at high risk for heart disease.  Cancer screening Lung Cancer  Lung cancer screening is recommended for adults 53-27 years old who are at high risk for lung cancer because of a history of smoking.  A yearly low-dose CT scan of the lungs is recommended for people who: ? Currently smoke. ? Have quit within the past 15 years. ? Have at least a 30-pack-year history of smoking. A pack year is smoking an average of one pack of cigarettes a day for 1 year.  Yearly screening should continue until it has been 15 years since you quit.  Yearly screening should stop if you develop a health problem that would prevent you from having lung cancer treatment.  Breast Cancer  Practice breast self-awareness. This means understanding how your breasts normally appear and feel.  It also means doing regular breast self-exams. Let your health care provider know about any changes, no matter how small.  If you are in your 20s or 30s, you should have a clinical breast exam (CBE) by a health care provider every 1-3 years as part of a regular health exam.  If you are 4 or older, have a CBE every year. Also consider having a breast X-ray (mammogram) every year.  If you have a family history of breast cancer, talk to your health care provider about  genetic screening.  If you are at high risk for breast cancer, talk to your health care provider about having an MRI and a mammogram every year.  Breast cancer gene (BRCA) assessment is recommended for women who have family members with BRCA-related cancers. BRCA-related cancers include: ? Breast. ? Ovarian. ? Tubal. ? Peritoneal cancers.  Results of the assessment will determine the need for genetic counseling and BRCA1 and BRCA2 testing.  Cervical Cancer Your health care provider may recommend that you be screened regularly for cancer of  the pelvic organs (ovaries, uterus, and vagina). This screening involves a pelvic examination, including checking for microscopic changes to the surface of your cervix (Pap test). You may be encouraged to have this screening done every 3 years, beginning at age 98.  For women ages 40-65, health care providers may recommend pelvic exams and Pap testing every 3 years, or they may recommend the Pap and pelvic exam, combined with testing for human papilloma virus (HPV), every 5 years. Some types of HPV increase your risk of cervical cancer. Testing for HPV may also be done on women of any age with unclear Pap test results.  Other health care providers may not recommend any screening for nonpregnant women who are considered low risk for pelvic cancer and who do not have symptoms. Ask your health care provider if a screening pelvic exam is right for you.  If you have had past treatment for cervical cancer or a condition that could lead to cancer, you need Pap tests and screening for cancer for at least 20 years after your treatment. If Pap tests have been discontinued, your risk factors (such as having a new sexual partner) need to be reassessed to determine if screening should resume. Some women have medical problems that increase the chance of getting cervical cancer. In these cases, your health care provider may recommend more frequent screening and Pap tests.  Colorectal Cancer  This type of cancer can be detected and often prevented.  Routine colorectal cancer screening usually begins at 44 years of age and continues through 44 years of age.  Your health care provider may recommend screening at an earlier age if you have risk factors for colon cancer.  Your health care provider may also recommend using home test kits to check for hidden blood in the stool.  A small camera at the end of a tube can be used to examine your colon directly (sigmoidoscopy or colonoscopy). This is done to check for the  earliest forms of colorectal cancer.  Routine screening usually begins at age 28.  Direct examination of the colon should be repeated every 5-10 years through 44 years of age. However, you may need to be screened more often if early forms of precancerous polyps or small growths are found.  Skin Cancer  Check your skin from head to toe regularly.  Tell your health care provider about any new moles or changes in moles, especially if there is a change in a mole's shape or color.  Also tell your health care provider if you have a mole that is larger than the size of a pencil eraser.  Always use sunscreen. Apply sunscreen liberally and repeatedly throughout the day.  Protect yourself by wearing long sleeves, pants, a wide-brimmed hat, and sunglasses whenever you are outside.  Heart disease, diabetes, and high blood pressure  High blood pressure causes heart disease and increases the risk of stroke. High blood pressure is more likely to develop in: ? People  who have blood pressure in the high end of the normal range (130-139/85-89 mm Hg). ? People who are overweight or obese. ? People who are African American.  If you are 5-22 years of age, have your blood pressure checked every 3-5 years. If you are 69 years of age or older, have your blood pressure checked every year. You should have your blood pressure measured twice-once when you are at a hospital or clinic, and once when you are not at a hospital or clinic. Record the average of the two measurements. To check your blood pressure when you are not at a hospital or clinic, you can use: ? An automated blood pressure machine at a pharmacy. ? A home blood pressure monitor.  If you are between 29 years and 27 years old, ask your health care provider if you should take aspirin to prevent strokes.  Have regular diabetes screenings. This involves taking a blood sample to check your fasting blood sugar level. ? If you are at a normal weight and  have a low risk for diabetes, have this test once every three years after 44 years of age. ? If you are overweight and have a high risk for diabetes, consider being tested at a younger age or more often. Preventing infection Hepatitis B  If you have a higher risk for hepatitis B, you should be screened for this virus. You are considered at high risk for hepatitis B if: ? You were born in a country where hepatitis B is common. Ask your health care provider which countries are considered high risk. ? Your parents were born in a high-risk country, and you have not been immunized against hepatitis B (hepatitis B vaccine). ? You have HIV or AIDS. ? You use needles to inject street drugs. ? You live with someone who has hepatitis B. ? You have had sex with someone who has hepatitis B. ? You get hemodialysis treatment. ? You take certain medicines for conditions, including cancer, organ transplantation, and autoimmune conditions.  Hepatitis C  Blood testing is recommended for: ? Everyone born from [birth year] through 1963. ? Anyone with known risk factors for hepatitis C.  Sexually transmitted infections (STIs)  You should be screened for sexually transmitted infections (STIs) including gonorrhea and chlamydia if: ? You are sexually active and are younger than 44 years of age. ? You are older than 44 years of age and your health care provider tells you that you are at risk for this type of infection. ? Your sexual activity has changed since you were last screened and you are at an increased risk for chlamydia or gonorrhea. Ask your health care provider if you are at risk.  If you do not have HIV, but are at risk, it may be recommended that you take a prescription medicine daily to prevent HIV infection. This is called pre-exposure prophylaxis (PrEP). You are considered at risk if: ? You are sexually active and do not regularly use condoms or know the HIV status of your partner(s). ? You take drugs by  injection. ? You are sexually active with a partner who has HIV.  Talk with your health care provider about whether you are at high risk of being infected with HIV. If you choose to begin PrEP, you should first be tested for HIV. You should then be tested every 3 months for as long as you are taking PrEP. Pregnancy  If you are premenopausal and you may become pregnant, ask your health care  provider about preconception counseling.  If you may become pregnant, take 400 to 800 micrograms (mcg) of folic acid every day.  If you want to prevent pregnancy, talk to your health care provider about birth control (contraception). Osteoporosis and menopause  Osteoporosis is a disease in which the bones lose minerals and strength with aging. This can result in serious bone fractures. Your risk for osteoporosis can be identified using a bone density scan.  If you are 35 years of age or older, or if you are at risk for osteoporosis and fractures, ask your health care provider if you should be screened.  Ask your health care provider whether you should take a calcium or vitamin D supplement to lower your risk for osteoporosis.  Menopause may have certain physical symptoms and risks.  Hormone replacement therapy may reduce some of these symptoms and risks. Talk to your health care provider about whether hormone replacement therapy is right for you. Follow these instructions at home:  Schedule regular health, dental, and eye exams.  Stay current with your immunizations.  Do not use any tobacco products including cigarettes, chewing tobacco, or electronic cigarettes.  If you are pregnant, do not drink alcohol.  If you are breastfeeding, limit how much and how often you drink alcohol.  Limit alcohol intake to no more than 1 drink per day for nonpregnant women. One drink equals 12 ounces of beer, 5 ounces of wine, or 1 ounces of hard liquor.  Do not use street drugs.  Do not share needles.  Ask  your health care provider for help if you need support or information about quitting drugs.  Tell your health care provider if you often feel depressed.  Tell your health care provider if you have ever been abused or do not feel safe at home. This information is not intended to replace advice given to you by your health care provider. Make sure you discuss any questions you have with your health care provider. Document Released: 01/09/2011 Document Revised: 12/02/2015 Document Reviewed: 03/30/2015 Elsevier Interactive Patient Education  [employer].

## 2017-03-08 LAB — COMPREHENSIVE METABOLIC PANEL
ALK PHOS: 61 U/L (ref 33–115)
ALT: 12 U/L (ref 6–29)
AST: 20 U/L (ref 10–30)
Albumin: 4 g/dL (ref 3.6–5.1)
BILIRUBIN TOTAL: 0.4 mg/dL (ref 0.2–1.2)
BUN: 9 mg/dL (ref 7–25)
CALCIUM: 9.3 mg/dL (ref 8.6–10.2)
CO2: 20 mmol/L (ref 20–32)
Chloride: 107 mmol/L (ref 98–110)
Creat: 0.72 mg/dL (ref 0.50–1.10)
GLUCOSE: 114 mg/dL — AB (ref 65–99)
POTASSIUM: 4 mmol/L (ref 3.5–5.3)
Sodium: 138 mmol/L (ref 135–146)
TOTAL PROTEIN: 7.2 g/dL (ref 6.1–8.1)

## 2017-03-08 LAB — TSH: TSH: 0.27 mIU/L — ABNORMAL LOW

## 2018-05-01 ENCOUNTER — Other Ambulatory Visit: Payer: Self-pay | Admitting: Women's Health

## 2018-05-01 DIAGNOSIS — Z3041 Encounter for surveillance of contraceptive pills: Secondary | ICD-10-CM

## 2018-06-18 ENCOUNTER — Ambulatory Visit (INDEPENDENT_AMBULATORY_CARE_PROVIDER_SITE_OTHER): Payer: BLUE CROSS/BLUE SHIELD | Admitting: Women's Health

## 2018-06-18 ENCOUNTER — Encounter: Payer: Self-pay | Admitting: Women's Health

## 2018-06-18 VITALS — BP 130/90 | Ht 68.0 in | Wt 178.6 lb

## 2018-06-18 DIAGNOSIS — Z01419 Encounter for gynecological examination (general) (routine) without abnormal findings: Secondary | ICD-10-CM

## 2018-06-18 DIAGNOSIS — Z3041 Encounter for surveillance of contraceptive pills: Secondary | ICD-10-CM

## 2018-06-18 MED ORDER — NORETHIN ACE-ETH ESTRAD-FE 1-20 MG-MCG PO TABS: ORAL_TABLET | ORAL | 4 refills | Status: DC

## 2018-06-18 NOTE — Progress Notes (Signed)
Brenda Reed [birth year] [id number]    History:    Presents for annual exam.  Monthly cycle on Loestrin wrth moderate relief of menorrhagia.  History of 3 small fibroids.  Endocrinologist manages thyroid.  Normal Pap and mammogram history.  Past medical history, past surgical history, family history and social history were all reviewed and documented in the EPIC chart.  Works at [employer], Network engineer job.  2 sons ages 44 and 71 both doing well.  ROS:  A ROS was performed and pertinent positives and negatives are included.  Exam:  Vitals:   06/18/18 1451  BP: 130/90  Weight: 178 lb 9.6 oz (81 kg)  Height: 5\' 8"  (1.727 m)   Body mass index is 27.16 kg/m.   General appearance:  Normal Thyroid:  Symmetrical, normal in size, without palpable masses or nodularity. Respiratory  Auscultation:  Clear without wheezing or rhonchi Cardiovascular  Auscultation:  Regular rate, without rubs, murmurs or gallops  Edema/varicosities:  Not grossly evident Abdominal  Soft,nontender, without masses, guarding or rebound.  Liver/spleen:  No organomegaly noted  Hernia:  None appreciated  Skin  Inspection:  Grossly normal   Breasts: Examined lying and sitting.     Right: Without masses, retractions, discharge or axillary adenopathy.     Left: Without masses, retractions, discharge or axillary adenopathy. Gentitourinary   Inguinal/mons:  Normal without inguinal adenopathy  External genitalia:  Normal  BUS/Urethra/Skene's glands:  Normal  Vagina:  Normal  Cervix:  Normal  Uterus:  normal in size, shape and contour.  Midline and mobile  Adnexa/parametria:     Rt: Without masses or tenderness.   Lt: Without masses or tenderness.  Anus and perineum: Normal  Digital rectal exam: Normal sphincter tone without palpated masses or tenderness  Assessment/Plan:  47 y.o. MWF G2 P2 for annual exam no complaints.  Monthly cycle on Loestrin Borderline blood pressure Hyperthyroidism-endocrinologist  manages Labs-primary care reports as normal  Plan: Loestrin 1/20 prescription, proper use given and reviewed slight risk for blood clots and strokes.  Instructed to recheck blood pressure, low-salt diet, if continues greater than 130/80 instructed to call or follow-up with primary care.  SBEs, continue annual screening mammogram, overdue reviewed importance of annual screening.  Exercise, calcium rich foods, vitamin D 2000 daily encouraged.  Pap normal 2017, new screening guidelines reviewed.    [name] [facility], 6:24 PM 06/18/2018

## 2018-06-18 NOTE — Patient Instructions (Signed)
[phone]  Breast center    Health Maintenance, Female Adopting a healthy lifestyle and getting preventive care can go a long way to promote health and wellness. Talk with your health care provider about what schedule of regular examinations is right for you. This is a good chance for you to check in with your provider about disease prevention and staying healthy. In between checkups, there are plenty of things you can do on your own. Experts have done a lot of research about which lifestyle changes and preventive measures are most likely to keep you healthy. Ask your health care provider for more information. Weight and diet Eat a healthy diet  Be sure to include plenty of vegetables, fruits, low-fat dairy products, and lean protein.  Do not eat a lot of foods high in solid fats, added sugars, or salt.  Get regular exercise. This is one of the most important things you can do for your health. ? Most adults should exercise for at least 150 minutes each week. The exercise should increase your heart rate and make you sweat (moderate-intensity exercise). ? Most adults should also do strengthening exercises at least twice a week. This is in addition to the moderate-intensity exercise.  Maintain a healthy weight  Body mass index (BMI) is a measurement that can be used to identify possible weight problems. It estimates body fat based on height and weight. Your health care provider can help determine your BMI and help you achieve or maintain a healthy weight.  For females 45 years of age and older: ? A BMI below 18.5 is considered underweight. ? A BMI of 18.5 to 24.9 is normal. ? A BMI of 25 to 29.9 is considered overweight. ? A BMI of 30 and above is considered obese.  Watch levels of cholesterol and blood lipids  You should start having your blood tested for lipids and cholesterol at 45 years of age, then have this test every 5 years.  You may need to have your cholesterol levels checked more  often if: ? Your lipid or cholesterol levels are high. ? You are older than 45 years of age. ? You are at high risk for heart disease.  Cancer screening Lung Cancer  Lung cancer screening is recommended for adults 31-53 years old who are at high risk for lung cancer because of a history of smoking.  A yearly low-dose CT scan of the lungs is recommended for people who: ? Currently smoke. ? Have quit within the past 15 years. ? Have at least a 30-pack-year history of smoking. A pack year is smoking an average of one pack of cigarettes a day for 1 year.  Yearly screening should continue until it has been 15 years since you quit.  Yearly screening should stop if you develop a health problem that would prevent you from having lung cancer treatment.  Breast Cancer  Practice breast self-awareness. This means understanding how your breasts normally appear and feel.  It also means doing regular breast self-exams. Let your health care provider know about any changes, no matter how small.  If you are in your 20s or 30s, you should have a clinical breast exam (CBE) by a health care provider every 1-3 years as part of a regular health exam.  If you are 50 or older, have a CBE every year. Also consider having a breast X-ray (mammogram) every year.  If you have a family history of breast cancer, talk to your health care provider about genetic screening.  If you are at high risk for breast cancer, talk to your health care provider about having an MRI and a mammogram every year.  Breast cancer gene (BRCA) assessment is recommended for women who have family members with BRCA-related cancers. BRCA-related cancers include: ? Breast. ? Ovarian. ? Tubal. ? Peritoneal cancers.  Results of the assessment will determine the need for genetic counseling and BRCA1 and BRCA2 testing.  Cervical Cancer Your health care provider may recommend that you be screened regularly for cancer of the pelvic organs  (ovaries, uterus, and vagina). This screening involves a pelvic examination, including checking for microscopic changes to the surface of your cervix (Pap test). You may be encouraged to have this screening done every 3 years, beginning at age 27.  For women ages 19-65, health care providers may recommend pelvic exams and Pap testing every 3 years, or they may recommend the Pap and pelvic exam, combined with testing for human papilloma virus (HPV), every 5 years. Some types of HPV increase your risk of cervical cancer. Testing for HPV may also be done on women of any age with unclear Pap test results.  Other health care providers may not recommend any screening for nonpregnant women who are considered low risk for pelvic cancer and who do not have symptoms. Ask your health care provider if a screening pelvic exam is right for you.  If you have had past treatment for cervical cancer or a condition that could lead to cancer, you need Pap tests and screening for cancer for at least 20 years after your treatment. If Pap tests have been discontinued, your risk factors (such as having a new sexual partner) need to be reassessed to determine if screening should resume. Some women have medical problems that increase the chance of getting cervical cancer. In these cases, your health care provider may recommend more frequent screening and Pap tests.  Colorectal Cancer  This type of cancer can be detected and often prevented.  Routine colorectal cancer screening usually begins at 45 years of age and continues through 45 years of age.  Your health care provider may recommend screening at an earlier age if you have risk factors for colon cancer.  Your health care provider may also recommend using home test kits to check for hidden blood in the stool.  A small camera at the end of a tube can be used to examine your colon directly (sigmoidoscopy or colonoscopy). This is done to check for the earliest forms of  colorectal cancer.  Routine screening usually begins at age 23.  Direct examination of the colon should be repeated every 5-10 years through 45 years of age. However, you may need to be screened more often if early forms of precancerous polyps or small growths are found.  Skin Cancer  Check your skin from head to toe regularly.  Tell your health care provider about any new moles or changes in moles, especially if there is a change in a mole's shape or color.  Also tell your health care provider if you have a mole that is larger than the size of a pencil eraser.  Always use sunscreen. Apply sunscreen liberally and repeatedly throughout the day.  Protect yourself by wearing long sleeves, pants, a wide-brimmed hat, and sunglasses whenever you are outside.  Heart disease, diabetes, and high blood pressure  High blood pressure causes heart disease and increases the risk of stroke. High blood pressure is more likely to develop in: ? People who have blood  pressure in the high end of the normal range (130-139/85-89 mm Hg). ? People who are overweight or obese. ? People who are African American.  If you are 65-57 years of age, have your blood pressure checked every 3-5 years. If you are 45 years of age or older, have your blood pressure checked every year. You should have your blood pressure measured twice-once when you are at a hospital or clinic, and once when you are not at a hospital or clinic. Record the average of the two measurements. To check your blood pressure when you are not at a hospital or clinic, you can use: ? An automated blood pressure machine at a pharmacy. ? A home blood pressure monitor.  If you are between 10 years and 29 years old, ask your health care provider if you should take aspirin to prevent strokes.  Have regular diabetes screenings. This involves taking a blood sample to check your fasting blood sugar level. ? If you are at a normal weight and have a low risk  for diabetes, have this test once every three years after 45 years of age. ? If you are overweight and have a high risk for diabetes, consider being tested at a younger age or more often. Preventing infection Hepatitis B  If you have a higher risk for hepatitis B, you should be screened for this virus. You are considered at high risk for hepatitis B if: ? You were born in a country where hepatitis B is common. Ask your health care provider which countries are considered high risk. ? Your parents were born in a high-risk country, and you have not been immunized against hepatitis B (hepatitis B vaccine). ? You have HIV or AIDS. ? You use needles to inject street drugs. ? You live with someone who has hepatitis B. ? You have had sex with someone who has hepatitis B. ? You get hemodialysis treatment. ? You take certain medicines for conditions, including cancer, organ transplantation, and autoimmune conditions.  Hepatitis C  Blood testing is recommended for: ? Everyone born from [birth year] through 1965. ? Anyone with known risk factors for hepatitis C.  Sexually transmitted infections (STIs)  You should be screened for sexually transmitted infections (STIs) including gonorrhea and chlamydia if: ? You are sexually active and are younger than 45 years of age. ? You are older than 45 years of age and your health care provider tells you that you are at risk for this type of infection. ? Your sexual activity has changed since you were last screened and you are at an increased risk for chlamydia or gonorrhea. Ask your health care provider if you are at risk.  If you do not have HIV, but are at risk, it may be recommended that you take a prescription medicine daily to prevent HIV infection. This is called pre-exposure prophylaxis (PrEP). You are considered at risk if: ? You are sexually active and do not regularly use condoms or know the HIV status of your partner(s). ? You take drugs by  injection. ? You are sexually active with a partner who has HIV.  Talk with your health care provider about whether you are at high risk of being infected with HIV. If you choose to begin PrEP, you should first be tested for HIV. You should then be tested every 3 months for as long as you are taking PrEP. Pregnancy  If you are premenopausal and you may become pregnant, ask your health care provider about preconception  counseling.  If you may become pregnant, take 400 to 800 micrograms (mcg) of folic acid every day.  If you want to prevent pregnancy, talk to your health care provider about birth control (contraception). Osteoporosis and menopause  Osteoporosis is a disease in which the bones lose minerals and strength with aging. This can result in serious bone fractures. Your risk for osteoporosis can be identified using a bone density scan.  If you are 54 years of age or older, or if you are at risk for osteoporosis and fractures, ask your health care provider if you should be screened.  Ask your health care provider whether you should take a calcium or vitamin D supplement to lower your risk for osteoporosis.  Menopause may have certain physical symptoms and risks.  Hormone replacement therapy may reduce some of these symptoms and risks. Talk to your health care provider about whether hormone replacement therapy is right for you. Follow these instructions at home:  Schedule regular health, dental, and eye exams.  Stay current with your immunizations.  Do not use any tobacco products including cigarettes, chewing tobacco, or electronic cigarettes.  If you are pregnant, do not drink alcohol.  If you are breastfeeding, limit how much and how often you drink alcohol.  Limit alcohol intake to no more than 1 drink per day for nonpregnant women. One drink equals 12 ounces of beer, 5 ounces of wine, or 1 ounces of hard liquor.  Do not use street drugs.  Do not share needles.  Ask  your health care provider for help if you need support or information about quitting drugs.  Tell your health care provider if you often feel depressed.  Tell your health care provider if you have ever been abused or do not feel safe at home. This information is not intended to replace advice given to you by your health care provider. Make sure you discuss any questions you have with your health care provider. Document Released: 01/09/2011 Document Revised: 12/02/2015 Document Reviewed: 03/30/2015 Elsevier Interactive Patient Education  [employer].

## 2018-07-24 ENCOUNTER — Other Ambulatory Visit: Payer: Self-pay | Admitting: Women's Health

## 2018-07-24 DIAGNOSIS — Z3041 Encounter for surveillance of contraceptive pills: Secondary | ICD-10-CM

## 2018-08-02 ENCOUNTER — Other Ambulatory Visit: Payer: Self-pay | Admitting: Women's Health

## 2018-08-02 DIAGNOSIS — Z1231 Encounter for screening mammogram for malignant neoplasm of breast: Secondary | ICD-10-CM

## 2018-08-05 ENCOUNTER — Ambulatory Visit
Admission: RE | Admit: 2018-08-05 | Discharge: 2018-08-05 | Disposition: A | Discharge status: Home / Self Care | Payer: BLUE CROSS/BLUE SHIELD | Source: Ambulatory Visit | Attending: Women's Health | Admitting: Women's Health

## 2018-08-05 DIAGNOSIS — Z1231 Encounter for screening mammogram for malignant neoplasm of breast: Secondary | ICD-10-CM

## 2019-06-23 ENCOUNTER — Ambulatory Visit (INDEPENDENT_AMBULATORY_CARE_PROVIDER_SITE_OTHER): Payer: BC Managed Care – PPO | Admitting: Women's Health

## 2019-06-23 ENCOUNTER — Encounter: Payer: Self-pay | Admitting: Women's Health

## 2019-06-23 ENCOUNTER — Other Ambulatory Visit: Payer: Self-pay

## 2019-06-23 VITALS — BP 158/80 | Ht 68.0 in | Wt 184.0 lb

## 2019-06-23 DIAGNOSIS — Z3041 Encounter for surveillance of contraceptive pills: Secondary | ICD-10-CM

## 2019-06-23 DIAGNOSIS — Z01419 Encounter for gynecological examination (general) (routine) without abnormal findings: Secondary | ICD-10-CM | POA: Diagnosis not present

## 2019-06-23 NOTE — Patient Instructions (Addendum)
Vit D 2000 iu daily Good to see you today!  Health Maintenance, Female Adopting a healthy lifestyle and getting preventive care are important in promoting health and wellness. Ask your health care provider about:  The right schedule for you to have regular tests and exams.  Things you can do on your own to prevent diseases and keep yourself healthy. What should I know about diet, weight, and exercise? Eat a healthy diet   Eat a diet that includes plenty of vegetables, fruits, low-fat dairy products, and lean protein.  Do not eat a lot of foods that are high in solid fats, added sugars, or sodium. Maintain a healthy weight Body mass index (BMI) is used to identify weight problems. It estimates body fat based on height and weight. Your health care provider can help determine your BMI and help you achieve or maintain a healthy weight. Get regular exercise Get regular exercise. This is one of the most important things you can do for your health. Most adults should:  Exercise for at least 150 minutes each week. The exercise should increase your heart rate and make you sweat (moderate-intensity exercise).  Do strengthening exercises at least twice a week. This is in addition to the moderate-intensity exercise.  Spend less time sitting. Even light physical activity can be beneficial. Watch cholesterol and blood lipids Have your blood tested for lipids and cholesterol at 46 years of age, then have this test every 5 years. Have your cholesterol levels checked more often if:  Your lipid or cholesterol levels are high.  You are older than 46 years of age.  You are at high risk for heart disease. What should I know about cancer screening? Depending on your health history and family history, you may need to have cancer screening at various ages. This may include screening for:  Breast cancer.  Cervical cancer.  Colorectal cancer.  Skin cancer.  Lung cancer. What should I know about  heart disease, diabetes, and high blood pressure? Blood pressure and heart disease  High blood pressure causes heart disease and increases the risk of stroke. This is more likely to develop in people who have high blood pressure readings, are of African descent, or are overweight.  Have your blood pressure checked: ? Every 3-5 years if you are 50-48 years of age. ? Every year if you are 26 years old or older. Diabetes Have regular diabetes screenings. This checks your fasting blood sugar level. Have the screening done:  Once every three years after age 48 if you are at a normal weight and have a low risk for diabetes.  More often and at a younger age if you are overweight or have a high risk for diabetes. What should I know about preventing infection? Hepatitis B If you have a higher risk for hepatitis B, you should be screened for this virus. Talk with your health care provider to find out if you are at risk for hepatitis B infection. Hepatitis C Testing is recommended for:  Everyone born from [birth year] through 1963.  Anyone with known risk factors for hepatitis C. Sexually transmitted infections (STIs)  Get screened for STIs, including gonorrhea and chlamydia, if: ? You are sexually active and are younger than 46 years of age. ? You are older than 46 years of age and your health care provider tells you that you are at risk for this type of infection. ? Your sexual activity has changed since you were last screened, and you are at increased  risk for chlamydia or gonorrhea. Ask your health care provider if you are at risk.  Ask your health care provider about whether you are at high risk for HIV. Your health care provider may recommend a prescription medicine to help prevent HIV infection. If you choose to take medicine to prevent HIV, you should first get tested for HIV. You should then be tested every 3 months for as long as you are taking the medicine. Pregnancy  If you are about to  stop having your period (premenopausal) and you may become pregnant, seek counseling before you get pregnant.  Take 400 to 800 micrograms (mcg) of folic acid every day if you become pregnant.  Ask for birth control (contraception) if you want to prevent pregnancy. Osteoporosis and menopause Osteoporosis is a disease in which the bones lose minerals and strength with aging. This can result in bone fractures. If you are 57 years old or older, or if you are at risk for osteoporosis and fractures, ask your health care provider if you should:  Be screened for bone loss.  Take a calcium or vitamin D supplement to lower your risk of fractures.  Be given hormone replacement therapy (HRT) to treat symptoms of menopause. Follow these instructions at home: Lifestyle  Do not use any products that contain nicotine or tobacco, such as cigarettes, e-cigarettes, and chewing tobacco. If you need help quitting, ask your health care provider.  Do not use street drugs.  Do not share needles.  Ask your health care provider for help if you need support or information about quitting drugs. Alcohol use  Do not drink alcohol if: ? Your health care provider tells you not to drink. ? You are pregnant, may be pregnant, or are planning to become pregnant.  If you drink alcohol: ? Limit how much you use to 0-1 drink a day. ? Limit intake if you are breastfeeding.  Be aware of how much alcohol is in your drink. In the [place] one drink equals one 12 oz bottle of beer (355 mL), one 5 oz glass of wine (148 mL), or one 1 oz glass of hard liquor (44 mL). General instructions  Schedule regular health, dental, and eye exams.  Stay current with your vaccines.  Tell your health care provider if: ? You often feel depressed. ? You have ever been abused or do not feel safe at home. Summary  Adopting a healthy lifestyle and getting preventive care are important in promoting health and wellness.  Follow your  health care provider's instructions about healthy diet, exercising, and getting tested or screened for diseases.  Follow your health care provider's instructions on monitoring your cholesterol and blood pressure. This information is not intended to replace advice given to you by your health care provider. Make sure you discuss any questions you have with your health care provider. Document Released: 01/09/2011 Document Revised: 06/19/2018 Document Reviewed: 06/19/2018 Elsevier Patient Education  2020 [employer].

## 2019-06-23 NOTE — Progress Notes (Signed)
Brenda Reed 09/29/1970 [id number]    History:    Presents for annual exam.  Lighter monthly cycle on Loestrin 1/20 but has had increased headaches week of menses despite even taking for 24 days of the month.  History of menorrhagia, 3 small fibroids.  Normal Pap and mammogram history.  History of anemia.  Hyperthyroid endocrinologist manages.  Primary care manages labs.  Past medical history, past surgical history, family history and social history were all reviewed and documented in the EPIC chart.  Works at [employer].  2 sons ages 46 and 32 both doing well and have received Gardasil.  Mother deceased from uterine cancer.  ROS:  A ROS was performed and pertinent positives and negatives are included.  Exam:  Vitals:   06/23/19 0834  BP: (!) 158/80  Weight: 184 lb (83.5 kg)  Height: 5\' 8"  (1.727 m)   Body mass index is 27.98 kg/m.   General appearance:  Normal Thyroid:  Symmetrical, normal in size, without palpable masses or nodularity. Respiratory  Auscultation:  Clear without wheezing or rhonchi Cardiovascular  Auscultation:  Regular rate, without rubs, murmurs or gallops  Edema/varicosities:  Not grossly evident Abdominal  Soft,nontender, without masses, guarding or rebound.  Liver/spleen:  No organomegaly noted  Hernia: Nontender umbilical hernia   Skin  Inspection:  Grossly normal   Breasts: Examined lying and sitting.     Right: Without masses, retractions, discharge or axillary adenopathy.     Left: Without masses, retractions, discharge or axillary adenopathy. Gentitourinary   Inguinal/mons:  Normal without inguinal adenopathy  External genitalia:  Normal  BUS/Urethra/Skene's glands:  Normal  Vagina:  Normal  Cervix:  Normal  Uterus:   normal in size, shape and contour.  Midline and mobile  Adnexa/parametria:     Rt: Without masses or tenderness.   Lt: Without masses or tenderness.  Anus and perineum: Normal  Digital rectal exam: Normal sphincter tone  without palpated masses or tenderness  Assessment/Plan:  48 y.o. MBF G2, P2 for annual exam with complaint of weight gain.  Monthly cycle on Loestrin with good relief of menorrhagia, increased migraines without aura History of 3 small fibroids Hyperthyroidism-endocrinologist manages  Borderline blood pressure has follow-up with primary care tomorrow Labs-primary care Asymptomatic umbilical hernia-years  Plan: Reviewed blood pressure elevated today does have follow-up tomorrow with primary care.  Reviewed importance of decreasing sodium in diet, decrease calories/carbs and importance of regular weightbearing/ cardio type exercise.  SBEs, continue annual 3D screening mammogram history of dense breasts.  Contraception reviewed would like to stop OCs at  end of pack and call if cycles become problematic.  Condoms encouraged.  Options of Mirena IUD discussed.  Umbilical hernia discussed, asymptomatic, reviewed importance of follow-up if tenderness, pain or change.  Pap with HR HPV typing, new screening guidelines reviewed.    [place], 9:09 AM 06/23/2019

## 2019-06-23 NOTE — Addendum Note (Signed)
Addended by: Lorine Bears on: 06/23/2019 09:44 AM   Modules accepted: Orders

## 2019-06-24 ENCOUNTER — Other Ambulatory Visit: Payer: Self-pay | Admitting: Women's Health

## 2019-06-24 DIAGNOSIS — Z1231 Encounter for screening mammogram for malignant neoplasm of breast: Secondary | ICD-10-CM

## 2019-06-24 LAB — PAP, TP IMAGING W/ HPV RNA, RFLX HPV TYPE 16,18/45: HPV DNA High Risk: NOT DETECTED

## 2019-08-13 ENCOUNTER — Ambulatory Visit
Admission: RE | Admit: 2019-08-13 | Discharge: 2019-08-13 | Disposition: A | Discharge status: Home / Self Care | Payer: BC Managed Care – PPO | Source: Ambulatory Visit | Attending: Women's Health | Admitting: Women's Health

## 2019-08-13 ENCOUNTER — Other Ambulatory Visit: Payer: Self-pay

## 2019-08-13 DIAGNOSIS — Z1231 Encounter for screening mammogram for malignant neoplasm of breast: Secondary | ICD-10-CM

## 2019-09-17 ENCOUNTER — Other Ambulatory Visit: Payer: Self-pay | Admitting: Women's Health

## 2019-09-17 DIAGNOSIS — Z3041 Encounter for surveillance of contraceptive pills: Secondary | ICD-10-CM

## 2019-10-04 ENCOUNTER — Ambulatory Visit: Payer: BC Managed Care – PPO | Attending: Internal Medicine

## 2019-10-04 DIAGNOSIS — Z23 Encounter for immunization: Secondary | ICD-10-CM

## 2019-10-04 NOTE — Progress Notes (Signed)
   Covid-19 Vaccination Clinic  Name:  Brenda Reed    MRN: [id number] DOB: 26-Aug-1970  10/04/2019  Brenda Reed was observed post Covid-19 immunization for 15 minutes without incident. She was provided with Vaccine Information Sheet and instruction to access the V-Safe system.   Brenda Reed was instructed to call 911 with any severe reactions post vaccine: Marland Kitchen Difficulty breathing  . Swelling of face and throat  . A fast heartbeat  . A bad rash all over body  . Dizziness and weakness   Immunizations Administered    Name Date Dose VIS Date Route   Pfizer COVID-19 Vaccine 10/04/2019  4:21 PM 0.3 mL 06/20/2019 Intramuscular   Manufacturer: [place]   Lot: [id number]   [place]: [id number]

## 2019-10-29 ENCOUNTER — Ambulatory Visit: Payer: BC Managed Care – PPO

## 2019-11-04 ENCOUNTER — Ambulatory Visit: Payer: BC Managed Care – PPO | Attending: Internal Medicine

## 2019-11-04 DIAGNOSIS — Z23 Encounter for immunization: Secondary | ICD-10-CM

## 2019-11-04 NOTE — Progress Notes (Signed)
   Covid-19 Vaccination Clinic  Name:  Perdita Neill    MRN: [id number] DOB: 03/03/1971  11/04/2019  Ms. Steinhaus was observed post Covid-19 immunization for 15 minutes without incident. She was provided with Vaccine Information Sheet and instruction to access the V-Safe system.   Ms. Ledet was instructed to call 911 with any severe reactions post vaccine: Marland Kitchen Difficulty breathing  . Swelling of face and throat  . A fast heartbeat  . A bad rash all over body  . Dizziness and weakness   Immunizations Administered    Name Date Dose VIS Date Route   Pfizer COVID-19 Vaccine 11/04/2019  3:15 PM 0.3 mL 09/03/2018 Intramuscular   Manufacturer: [place]   Lot: [id number]   [place]: [id number]

## 2021-04-18 IMAGING — MG DIGITAL SCREENING BILAT W/ TOMO W/ CAD
8 series · 8 of 24 positions shown · non-contrast
Comparison: Previous exam(s).

CLINICAL DATA: Screening.

EXAM:
DIGITAL SCREENING BILATERAL MAMMOGRAM WITH TOMO AND CAD

[L MLO synth-2D]
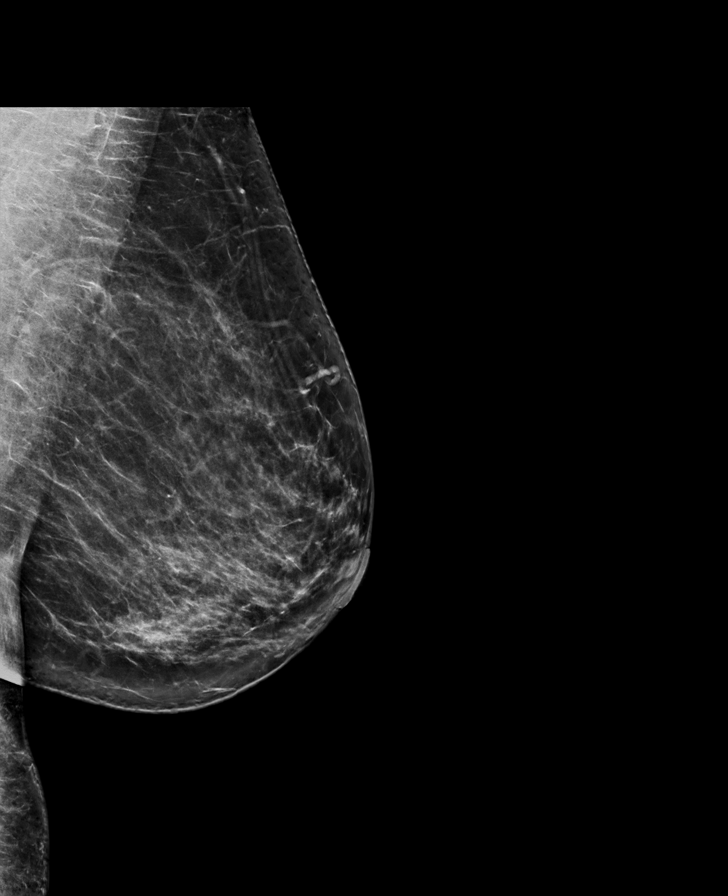

[R CC synth-2D]
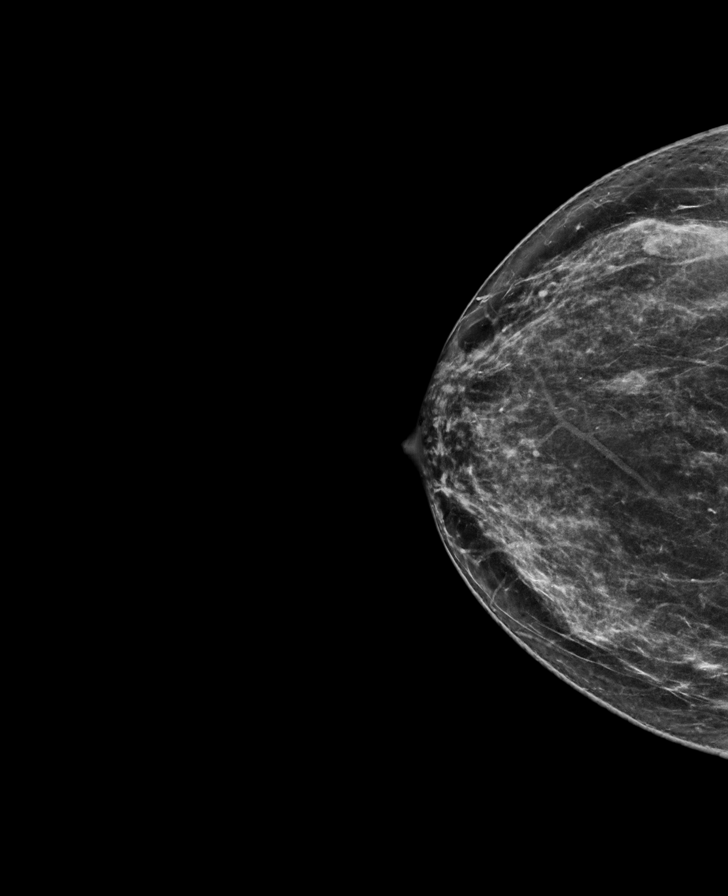

[L CC synth-2D]
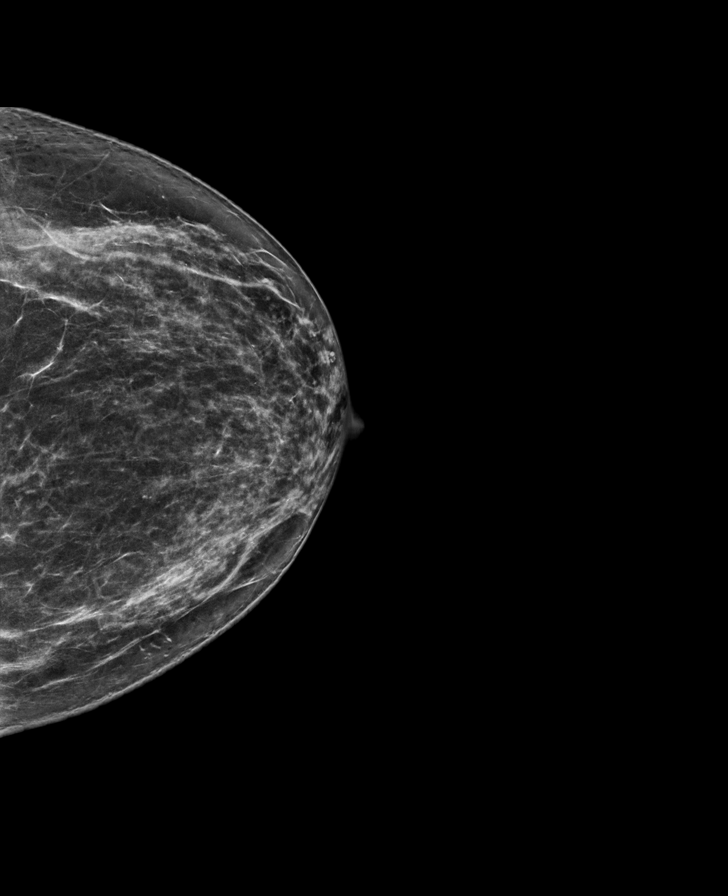

[R MLO synth-2D]
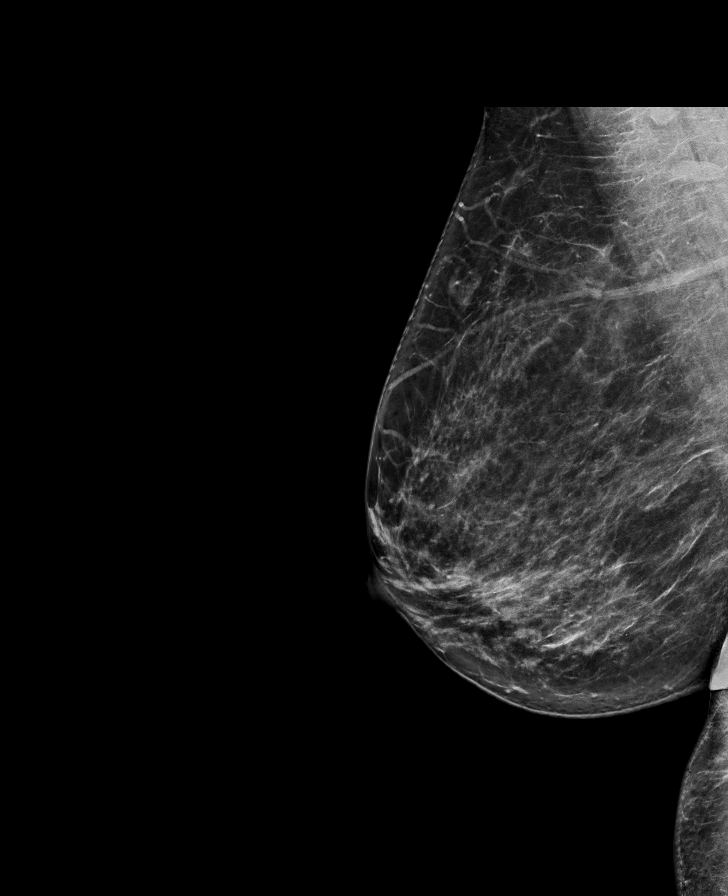

[L MLO tomo · tomo slice 42/83.0]
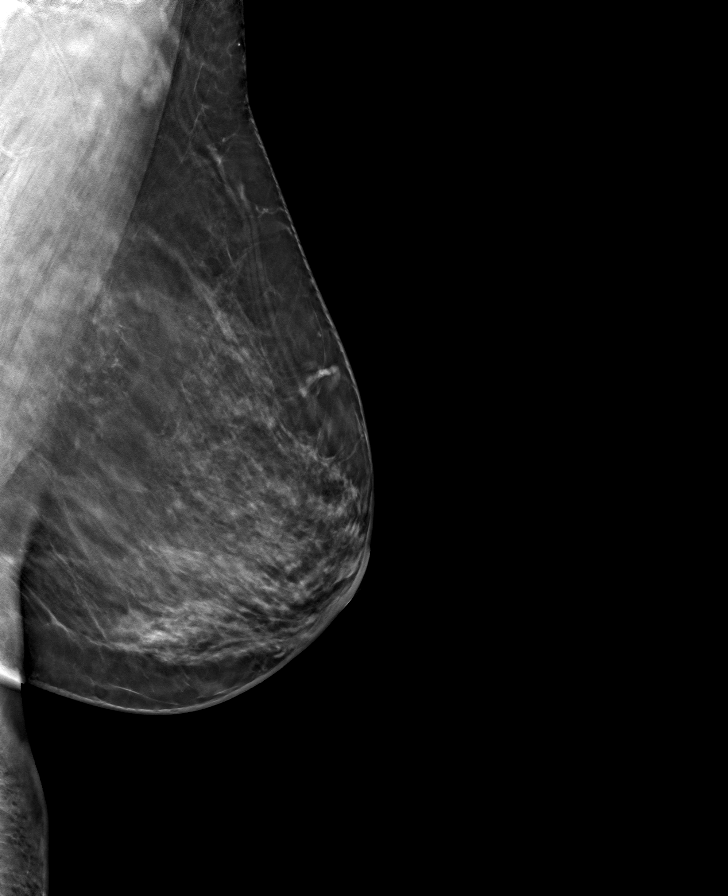

[R CC tomo · tomo slice 35/70.0]
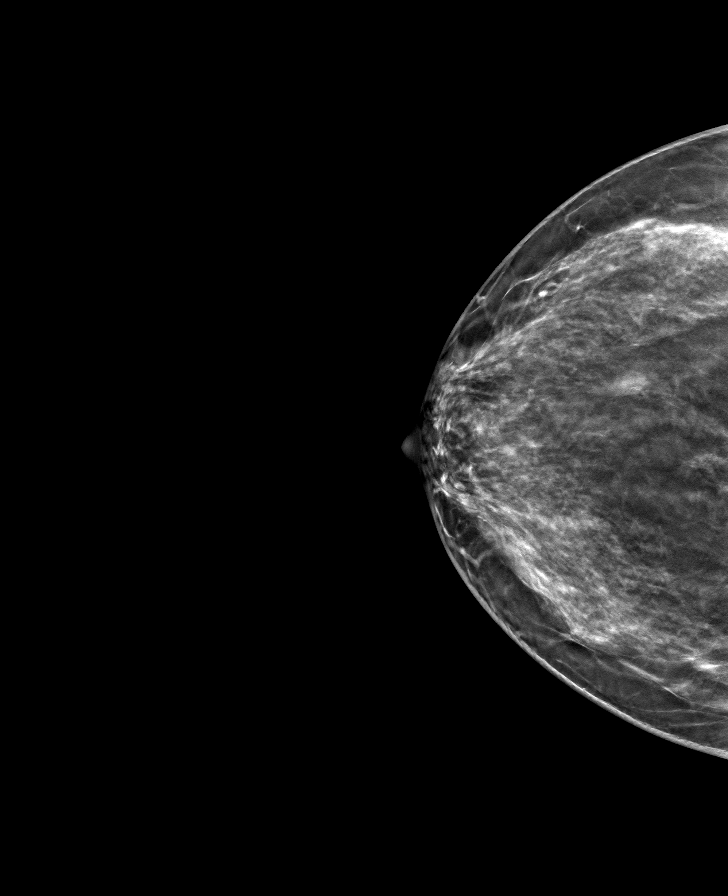

[L CC tomo · tomo slice 37/74.0]
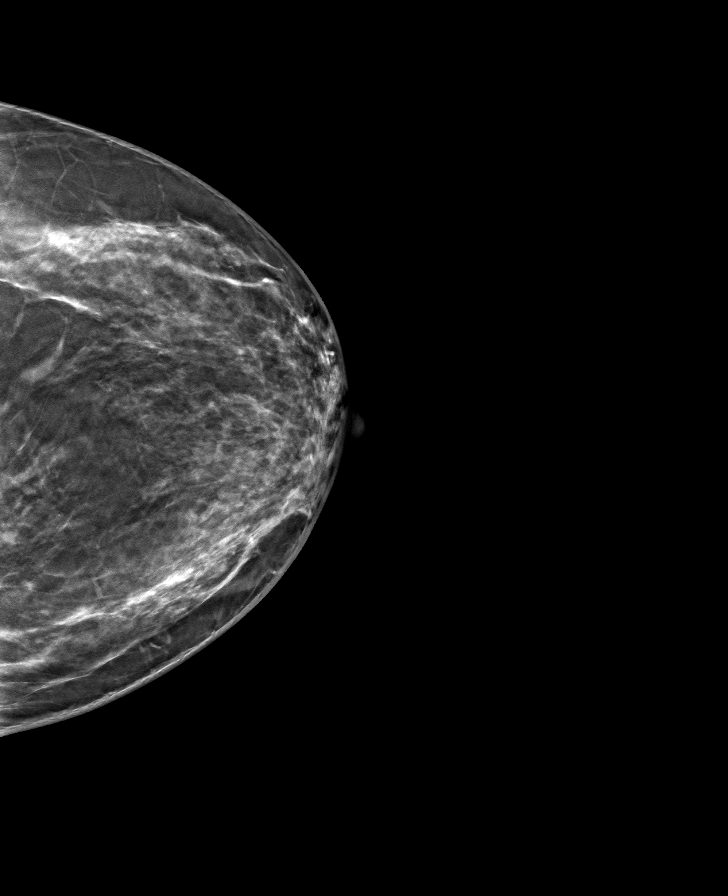

[R MLO tomo · tomo slice 41/82.0]
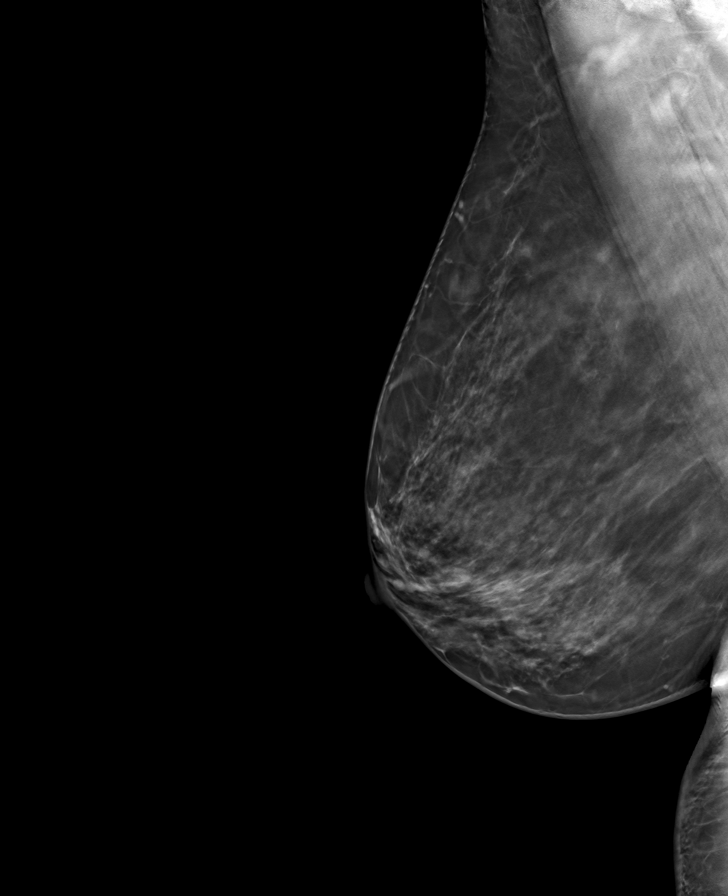

[8 of 24 positions shown; findings below may reference images not displayed]

ACR Breast Density Category c: The breast tissue is heterogeneously
dense, which may obscure small masses.
FINDINGS: There are no findings suspicious for malignancy. Images were
processed with CAD.
IMPRESSION: No mammographic evidence of malignancy. A result letter of this
screening mammogram will be mailed directly to the patient.

RECOMMENDATION:
Screening mammogram in one year. (Code:[id number])

BI-RADS CATEGORY  1: Negative.

## 2022-05-26 ENCOUNTER — Ambulatory Visit (INDEPENDENT_AMBULATORY_CARE_PROVIDER_SITE_OTHER): Payer: BC Managed Care – PPO | Admitting: Radiology

## 2022-05-26 ENCOUNTER — Encounter: Payer: Self-pay | Admitting: Radiology

## 2022-05-26 ENCOUNTER — Other Ambulatory Visit (HOSPITAL_COMMUNITY)
Admission: RE | Admit: 2022-05-26 | Discharge: 2022-05-26 | Disposition: A | Discharge status: Home / Self Care | Payer: BC Managed Care – PPO | Source: Ambulatory Visit | Attending: Radiology | Admitting: Radiology

## 2022-05-26 VITALS — BP 120/82 | Ht 68.0 in | Wt 198.0 lb

## 2022-05-26 DIAGNOSIS — Z308 Encounter for other contraceptive management: Secondary | ICD-10-CM

## 2022-05-26 DIAGNOSIS — Z01419 Encounter for gynecological examination (general) (routine) without abnormal findings: Secondary | ICD-10-CM | POA: Diagnosis present

## 2022-05-26 MED ORDER — PHEXXI 1.8-1-0.4 % VA GEL: 5.0000 g | VAGINAL | 9 refills | Status: AC

## 2022-05-26 NOTE — Progress Notes (Signed)
   Brenda Reed 01/18/1971 [id number]   History:  51 y.o. G2P2 presents for annual exam, last visit 3 years ago. Due for pap and mammogram. Stopped OCPs, did not like the hormonal side effects.  Gynecologic History Patient's last menstrual period was 05/13/2022 (exact date). Period Cycle (Days): 28 Period Duration (Days): 6 Period Pattern: Regular Menstrual Flow: Heavy Menstrual Control: Tampon, Maxi pad Dysmenorrhea: (!) Mild Dysmenorrhea Symptoms: Headache Contraception/Family planning: none Sexually active: yes Last Pap: 2020. Results were: normal Last mammogram: 2021. Results were: normal  Obstetric History OB History  Gravida Para Term Preterm AB Living  '2 2 2     2  '$ SAB IAB Ectopic Multiple Live Births               # Outcome Date GA Lbr Len/2nd Weight Sex Delivery Anes PTL Lv  2 Term           1 Term              The following portions of the patient's history were reviewed and updated as appropriate: allergies, current medications, past family history, past medical history, past social history, past surgical history, and problem list.  Review of Systems Pertinent items noted in HPI and remainder of comprehensive ROS otherwise negative.   Past medical history, past surgical history, family history and social history were all reviewed and documented in the EPIC chart.   Exam:  Vitals:   05/26/22 0820  BP: 120/82  Weight: 198 lb (89.8 kg)  Height: '5\' 8"'$  (1.727 m)   Body mass index is 30.11 kg/m.  General appearance:  Normal Thyroid:  Symmetrical, normal in size, without palpable masses or nodularity. Respiratory  Auscultation:  Clear without wheezing or rhonchi Cardiovascular  Auscultation:  Regular rate, without rubs, murmurs or gallops  Edema/varicosities:  Not grossly evident Abdominal  Soft,nontender, without masses, guarding or rebound.  Liver/spleen:  No organomegaly noted  Hernia:  None appreciated  Skin  Inspection:  Grossly  normal Breasts: Examined lying and sitting.   Right: Without masses, retractions, nipple discharge or axillary adenopathy.   Left: Without masses, retractions, nipple discharge or axillary adenopathy. Genitourinary   Inguinal/mons:  Normal without inguinal adenopathy  External genitalia:  Normal appearing vulva with no masses, tenderness, or lesions  BUS/Urethra/Skene's glands:  Normal without masses or exudate  Vagina:  Normal appearing with normal color and discharge, no lesions  Cervix:  Normal appearing without discharge or lesions  Uterus:  Normal in size, shape and contour.  Mobile, nontender  Adnexa/parametria:     Rt: Normal in size, without masses or tenderness.   Lt: Normal in size, without masses or tenderness.  Anus and perineum: Normal   Patient informed chaperone available to be present for breast and pelvic exam. Patient has requested no chaperone to be present. Patient has been advised what will be completed during breast and pelvic exam.   Assessment/Plan:   1. Well woman exam with routine gynecological exam - Schedule overdue mammogram at [facility] - Cytology - PAP( [place])  2. Encounter for other contraceptive management Phexxi rx sent Declines hormones BC, does not want an IUD     Discussed SBE, colonoscopy and DEXA screening as directed/appropriate. Recommend 187mns of exercise weekly, including weight bearing exercise. Encouraged the use of seatbelts and sunscreen. Return in 1 year for annual or as needed.   CRubbie BattiestB WHNP-BC 8:55 AM 05/26/2022

## 2022-05-31 LAB — CYTOLOGY - PAP
Comment: NEGATIVE
Diagnosis: UNDETERMINED — AB
High risk HPV: NEGATIVE

## 2022-08-04 ENCOUNTER — Other Ambulatory Visit (HOSPITAL_COMMUNITY): Payer: Self-pay | Admitting: Radiology

## 2022-08-04 DIAGNOSIS — R053 Chronic cough: Secondary | ICD-10-CM

## 2022-11-07 ENCOUNTER — Other Ambulatory Visit: Payer: Self-pay | Admitting: Family Medicine

## 2022-11-07 DIAGNOSIS — Z0000 Encounter for general adult medical examination without abnormal findings: Secondary | ICD-10-CM

## 2022-11-08 ENCOUNTER — Ambulatory Visit
Admission: RE | Admit: 2022-11-08 | Discharge: 2022-11-08 | Disposition: A | Discharge status: Home / Self Care | Payer: BC Managed Care – PPO | Source: Ambulatory Visit | Attending: Family Medicine | Admitting: Family Medicine

## 2022-11-08 DIAGNOSIS — Z0000 Encounter for general adult medical examination without abnormal findings: Secondary | ICD-10-CM

## 2023-01-04 ENCOUNTER — Ambulatory Visit (HOSPITAL_COMMUNITY)
Admission: RE | Admit: 2023-01-04 | Discharge: 2023-01-04 | Disposition: A | Discharge status: Home / Self Care | Payer: BC Managed Care – PPO | Source: Ambulatory Visit | Attending: Family Medicine | Admitting: Family Medicine

## 2023-01-04 DIAGNOSIS — R053 Chronic cough: Secondary | ICD-10-CM | POA: Diagnosis present

## 2023-01-04 LAB — PULMONARY FUNCTION TEST
DL/VA % pred: 85 %
DL/VA: 3.59 ml/min/mmHg/L
DLCO unc % pred: 60 %
DLCO unc: 14.4 ml/min/mmHg
FEF 25-75 Post: 2.29 L/sec
FEF 25-75 Pre: 2.33 L/sec
FEF2575-%Change-Post: -1 %
FEF2575-%Pred-Post: 76 %
FEF2575-%Pred-Pre: 77 %
FEV1-%Change-Post: 0 %
FEV1-%Pred-Post: 68 %
FEV1-%Pred-Pre: 68 %
FEV1-Post: 2.17 L
FEV1-Pre: 2.18 L
FEV1FVC-%Change-Post: 1 %
FEV1FVC-%Pred-Pre: 103 %
FEV6-%Change-Post: -1 %
FEV6-%Pred-Post: 66 %
FEV6-%Pred-Pre: 67 %
FEV6-Post: 2.57 L
FEV6-Pre: 2.63 L
FEV6FVC-%Change-Post: 0 %
FEV6FVC-%Pred-Post: 102 %
FEV6FVC-%Pred-Pre: 102 %
FVC-%Change-Post: -2 %
FVC-%Pred-Post: 64 %
FVC-%Pred-Pre: 65 %
FVC-Post: 2.57 L
FVC-Pre: 2.63 L
Post FEV1/FVC ratio: 84 %
Post FEV6/FVC ratio: 100 %
Pre FEV1/FVC ratio: 83 %
Pre FEV6/FVC Ratio: 100 %
RV % pred: 90 %
RV: 1.75 L
TLC % pred: 78 %
TLC: 4.35 L

## 2023-01-04 MED ORDER — ALBUTEROL SULFATE (2.5 MG/3ML) 0.083% IN NEBU
2.5000 mg | INHALATION_SOLUTION | Freq: Once | RESPIRATORY_TRACT | Status: AC
  Administered 2023-01-04: 2.5 mg via RESPIRATORY_TRACT

## 2023-03-02 ENCOUNTER — Ambulatory Visit (INDEPENDENT_AMBULATORY_CARE_PROVIDER_SITE_OTHER): Payer: BC Managed Care – PPO | Admitting: Radiology

## 2023-03-02 VITALS — BP 136/84

## 2023-03-02 DIAGNOSIS — N920 Excessive and frequent menstruation with regular cycle: Secondary | ICD-10-CM | POA: Diagnosis not present

## 2023-03-02 DIAGNOSIS — D219 Benign neoplasm of connective and other soft tissue, unspecified: Secondary | ICD-10-CM

## 2023-03-02 MED ORDER — NORETHINDRONE 0.35 MG PO TABS
1.0000 | ORAL_TABLET | Freq: Every day | ORAL | 0 refills | Status: DC
Start: 2023-03-02 — End: 2023-05-25

## 2023-03-02 NOTE — Progress Notes (Signed)
   Brenda Reed 14-Jan-1971 [id number]   History:  52 y.o. G2P2 with persistent anemia despite medical management. Periods are heavy and long. Lasting 8-9 days. Changing an ultra tampon and a pad every 3 hours. She had an u/s in 2017 that showed multiple small fibroids.   Gynecologic History Patient's last menstrual period was 02/22/2023 (approximate).   Contraception/Family planning: vaginal spermicide Sexually active: yes Last Pap: 11/23. Results were: ASCUS   Obstetric History OB History  Gravida Para Term Preterm AB Living  2 2 2     2   SAB IAB Ectopic Multiple Live Births               # Outcome Date GA Lbr Len/2nd Weight Sex Type Anes PTL Lv  2 Term           1 Term              The following portions of the patient's history were reviewed and updated as appropriate: allergies, current medications, past family history, past medical history, past social history, past surgical history, and problem list.  Review of Systems Pertinent items noted in HPI and remainder of comprehensive ROS otherwise negative.   Past medical history, past surgical history, family history and social history were all reviewed and documented in the EPIC chart.   Exam:  Vitals:   03/02/23 0831  BP: 136/84   There is no height or weight on file to calculate BMI.  General appearance:  Normal  Abdominal  Soft,nontender, without masses, guarding or rebound.  Liver/spleen:  No organomegaly noted  Hernia:  None appreciated Genitourinary   Inguinal/mons:  Normal without inguinal adenopathy  External genitalia:  Normal appearing vulva with no masses, tenderness, or lesions  BUS/Urethra/Skene's glands:  Normal without masses or exudate  Vagina:  Normal appearing with normal color and discharge, no lesions  Cervix:  Normal appearing without discharge or lesions  Uterus:  Enlarged to 10-12 week size. Mobile, nontender  Adnexa/parametria:     Rt: Normal in size, without masses or  tenderness.   Lt: Normal in size, without masses or tenderness.  Anus and perineum: Normal   Brenda Reed, CMA present for exam  Assessment/Plan:   1. Fibroids - US PELVIC COMPLETE WITH TRANSVAGINAL; Future Consider IUD depending on location of fibroids. Lysteda or  myfembree are also options discussed  2. Menorrhagia with regular cycle - US PELVIC COMPLETE WITH TRANSVAGINAL; Future - norethindrone (MICRONOR) 0.35 MG tablet; Take 1 tablet (0.35 mg total) by mouth daily.  Dispense: 84 tablet; Refill: 0       Brenda Reed B WHNP-BC 8:43 AM 03/02/2023

## 2023-04-10 ENCOUNTER — Other Ambulatory Visit: Payer: Self-pay | Admitting: Family Medicine

## 2023-04-10 DIAGNOSIS — R053 Chronic cough: Secondary | ICD-10-CM

## 2023-05-09 ENCOUNTER — Other Ambulatory Visit: Payer: Self-pay

## 2023-05-25 ENCOUNTER — Other Ambulatory Visit: Payer: Self-pay

## 2023-05-25 DIAGNOSIS — N920 Excessive and frequent menstruation with regular cycle: Secondary | ICD-10-CM

## 2023-05-25 NOTE — Telephone Encounter (Signed)
Medication refill request: Micronor  Last AEX:  05/26/22 Next AEX: not scheduled  Last MMG (if hormonal medication request): n/a Refill authorized: #28 pended for today

## 2023-05-29 ENCOUNTER — Ambulatory Visit
Admission: RE | Admit: 2023-05-29 | Discharge: 2023-05-29 | Disposition: A | Discharge status: Home / Self Care | Payer: BC Managed Care – PPO | Source: Ambulatory Visit | Attending: Family Medicine | Admitting: Family Medicine

## 2023-05-29 DIAGNOSIS — R053 Chronic cough: Secondary | ICD-10-CM

## 2023-05-29 MED ORDER — NORETHINDRONE 0.35 MG PO TABS: 1.0000 | ORAL_TABLET | Freq: Every day | ORAL | 0 refills | Status: DC

## 2023-08-21 ENCOUNTER — Other Ambulatory Visit: Payer: Self-pay | Admitting: Radiology

## 2023-08-21 DIAGNOSIS — N920 Excessive and frequent menstruation with regular cycle: Secondary | ICD-10-CM

## 2023-08-21 NOTE — Telephone Encounter (Signed)
Med refill request:Micronor 0.35mg  tablet  Last AEX: 05/26/22 last OV 03/02/23 Next AEX: not scheduled  Last MMG (if hormonal med) 11/08/22 birads cat 1 neg Refill authorized: Last rx 05/29/23 #84 0 refills. Please approve or deny as appropriate.

## 2023-11-15 ENCOUNTER — Other Ambulatory Visit: Payer: Self-pay | Admitting: Nurse Practitioner

## 2023-11-15 DIAGNOSIS — N920 Excessive and frequent menstruation with regular cycle: Secondary | ICD-10-CM

## 2023-11-15 NOTE — Telephone Encounter (Signed)
 Med refill request: micronor  Last AEX: 05/16/22 last OV 03/02/23  Next AEX: not scheduled message sent to FD to call pt to schedule  Last MMG (if hormonal med) 11/08/22 BIRADS cat 1 neg  Refill authorized: Last rx 08/21/23 #84 with 0 refills. Please approve or deny

## 2023-11-28 NOTE — Telephone Encounter (Signed)
 Pt LVM in triage line stating that she needs a refill of her medication. However, she cannot get one unless she makes and appt and cannot make an appt at this time. Requesting a cb.

## 2023-11-29 MED ORDER — NORETHINDRONE 0.35 MG PO TABS
1.0000 | ORAL_TABLET | Freq: Every day | ORAL | 0 refills | Status: DC
Start: 2023-11-29 — End: 2024-02-07

## 2023-11-29 NOTE — Telephone Encounter (Signed)
 Pt reports about to be travelling and will not be returning until end of June.   Got pt scheduled for 01/07/2024 w/ JC.  Requesting a refill to get her until scheduled appt.   Rx pend.

## 2023-11-29 NOTE — Addendum Note (Signed)
 Addended by: Levon Reap D on: 11/29/2023 10:42 AM   Modules accepted: Orders

## 2024-01-07 ENCOUNTER — Ambulatory Visit: Admitting: Radiology

## 2024-02-06 ENCOUNTER — Ambulatory Visit: Admitting: Radiology

## 2024-02-07 ENCOUNTER — Encounter: Payer: Self-pay | Admitting: Radiology

## 2024-02-07 ENCOUNTER — Ambulatory Visit (INDEPENDENT_AMBULATORY_CARE_PROVIDER_SITE_OTHER): Admitting: Radiology

## 2024-02-07 VITALS — BP 116/70 | HR 81 | Ht 68.25 in | Wt 197.0 lb

## 2024-02-07 DIAGNOSIS — Z1331 Encounter for screening for depression: Secondary | ICD-10-CM

## 2024-02-07 DIAGNOSIS — D219 Benign neoplasm of connective and other soft tissue, unspecified: Secondary | ICD-10-CM

## 2024-02-07 DIAGNOSIS — Z01419 Encounter for gynecological examination (general) (routine) without abnormal findings: Secondary | ICD-10-CM

## 2024-02-07 DIAGNOSIS — N920 Excessive and frequent menstruation with regular cycle: Secondary | ICD-10-CM

## 2024-02-07 MED ORDER — SLYND 4 MG PO TABS
1.0000 | ORAL_TABLET | Freq: Every day | ORAL | 0 refills | Status: DC
Start: 2024-02-07 — End: 2024-02-12

## 2024-02-07 NOTE — Progress Notes (Signed)
 Brenda Reed 05/21/1971 [id number]   History:  53 y.o. G2P2 presents for annual exam. Periods are still heavy with POPs, anemia and fatigue. Current iron supplement causes nausea, having trouble taking is consistently due to side effects.  Gynecologic History Patient's last menstrual period was 01/07/2024 (approximate). Period Cycle (Days):  (skipping periods) Period Duration (Days): 10 Period Pattern: (!) Irregular Menstrual Flow: Heavy Menstrual Control: Thin pad, Maxi pad, Tampon Dysmenorrhea: (!) Moderate Dysmenorrhea Symptoms: Cramping Contraception/Family planning: none Sexually active: yes Last Pap: 2023. Results were: ASCUS HPV negative Last mammogram: 11/2022. Results were: normal  Obstetric History OB History  Gravida Para Term Preterm AB Living  2 2 2   2   SAB IAB Ectopic Multiple Live Births          # Outcome Date GA Lbr Len/2nd Weight Sex Type Anes PTL Lv  2 Term           1 Term              The following portions of the patient's history were reviewed and updated as appropriate: allergies, current medications, past family history, past medical history, past social history, past surgical history, and problem list.  Review of Systems Pertinent items noted in HPI and remainder of comprehensive ROS otherwise negative.   Past medical history, past surgical history, family history and social history were all reviewed and documented in the EPIC chart.     02/07/2024    3:46 PM  Depression screen PHQ 2/9  Decreased Interest 0  Down, Depressed, Hopeless 0  PHQ - 2 Score 0    Exam:  Vitals:   02/07/24 1544  BP: 116/70  Pulse: 81  SpO2: 99%  Weight: 197 lb (89.4 kg)  Height: 5' 8.25 (1.734 m)    Body mass index is 29.73 kg/m.  General appearance:  Normal Thyroid :  Symmetrical, normal in size, without palpable masses or nodularity. Respiratory  Auscultation:  Clear without wheezing or rhonchi Cardiovascular  Auscultation:  Regular  rate, without rubs, murmurs or gallops  Edema/varicosities:  Not grossly evident Abdominal  Soft,nontender, without masses, guarding or rebound.  Liver/spleen:  No organomegaly noted  Hernia:  None appreciated  Skin  Inspection:  Grossly normal Breasts: Examined lying and sitting.   Right: Without masses, retractions, nipple discharge or axillary adenopathy.   Left: Without masses, retractions, nipple discharge or axillary adenopathy. Genitourinary   Inguinal/mons:  Normal without inguinal adenopathy  External genitalia:  Normal appearing vulva with no masses, tenderness, or lesions  BUS/Urethra/Skene's glands:  Normal without masses or exudate  Vagina:  Normal appearing with normal color and discharge, no lesions  Cervix:  Normal appearing without discharge or lesions  Uterus:  Normal in size, shape and contour.  Mobile, nontender  Adnexa/parametria:     Rt: Normal in size, without masses or tenderness.   Lt: Normal in size, without masses or tenderness.  Anus and perineum: Normal   Darice Hoit, CMA present for exam  Assessment/Plan:   1. Well woman exam with routine gynecological exam (Primary) Pap 2026 Schedule overdue mammogram  2. Menorrhagia with regular cycle Will change to slynd  to control bleeding. Does not want an IUD or estrogen containing method. - CBC - Iron, TIBC and Ferritin Panel - Drospirenone  (SLYND ) 4 MG TABS; Take 1 tablet (4 mg total) by mouth daily.  Dispense: 84 tablet; Refill: 0  3. Fibroids  4. Depression screen negative    Return in 1 year for annual or as needed.  GINETTE COZIER B WHNP-BC 3:50 PM 02/07/2024

## 2024-02-08 ENCOUNTER — Ambulatory Visit: Payer: Self-pay | Admitting: Radiology

## 2024-02-08 ENCOUNTER — Telehealth: Payer: Self-pay

## 2024-02-08 LAB — IRON,TIBC AND FERRITIN PANEL
%SAT: 5 % — ABNORMAL LOW (ref 16–45)
Ferritin: 5 ng/mL — ABNORMAL LOW (ref 16–232)
Iron: 24 ug/dL — ABNORMAL LOW (ref 45–160)
TIBC: 491 ug/dL — ABNORMAL HIGH (ref 250–450)

## 2024-02-08 LAB — CBC
HCT: 37.5 % (ref 35.0–45.0)
Hemoglobin: 11.3 g/dL — ABNORMAL LOW (ref 11.7–15.5)
MCH: 23.4 pg — ABNORMAL LOW (ref 27.0–33.0)
MCHC: 30.1 g/dL — ABNORMAL LOW (ref 32.0–36.0)
MCV: 77.8 fL — ABNORMAL LOW (ref 80.0–100.0)
MPV: 11.7 fL (ref 7.5–12.5)
Platelets: 279 10*3/uL (ref 140–400)
RBC: 4.82 Million/uL (ref 3.80–5.10)
RDW: 14.4 % (ref 11.0–15.0)
WBC: 7.2 10*3/uL (ref 3.8–10.8)

## 2024-02-08 NOTE — Telephone Encounter (Signed)
 LM for patient with option of new oral contraceptive that she would take continuously called Junel FE 24.  Patient informed this oral contraceptive did contain estrogen.  MyChart message sent to the patient as well with above information.  Patient was instructed to call or message back if she would like Jami to send in this new prescription.

## 2024-02-08 NOTE — Telephone Encounter (Signed)
 [facility] sent Drug Change Request form for Slynd .  Drug is not covered by patient's insurance plan.  I attempted to complete a prior authorization, however I did not have the patient's prescription plan information.  I contacted the patient and she stated she did not have a prescription insurance card and did not know who her prescription benefits were through.  I let her know the Slynd  was not on her insurance formulary and I would check with Jami for recommendations.

## 2024-02-08 NOTE — Telephone Encounter (Signed)
 Can try Junel fe 24 continuously if she is willing to try an estrogen containing pill.

## 2024-02-12 ENCOUNTER — Other Ambulatory Visit: Payer: Self-pay | Admitting: Radiology

## 2024-02-12 DIAGNOSIS — N920 Excessive and frequent menstruation with regular cycle: Secondary | ICD-10-CM

## 2024-02-12 MED ORDER — JUNEL FE 24 1-20 MG-MCG(24) PO TABS
1.0000 | ORAL_TABLET | Freq: Every day | ORAL | 4 refills | Status: DC
Start: 2024-02-12 — End: 2024-03-03

## 2024-03-03 ENCOUNTER — Other Ambulatory Visit: Payer: Self-pay

## 2024-03-03 DIAGNOSIS — N920 Excessive and frequent menstruation with regular cycle: Secondary | ICD-10-CM

## 2024-03-03 NOTE — Telephone Encounter (Signed)
 Med refill request:  Junel  FE 24  Last filled: 02/12/24 - #84 tablets with 4 refills Last AEX:  02/07/24 Next AEX: not yet scheduled Last Mammo: 11/08/22 Pt is requesting Rx to be sent to different Pharmacy. Original Rx went to [facility]. New Rx to go to [facility] on [address].

## 2024-03-04 MED ORDER — JUNEL FE 24 1-20 MG-MCG(24) PO TABS
1.0000 | ORAL_TABLET | Freq: Every day | ORAL | 4 refills | Status: AC
Start: 2024-03-04 — End: ?

## 2024-03-21 NOTE — Progress Notes (Signed)
 Can we send a letter? If no response then lost to follow up.

## 2024-03-26 ENCOUNTER — Telehealth: Payer: Self-pay | Admitting: *Deleted

## 2024-03-26 DIAGNOSIS — N920 Excessive and frequent menstruation with regular cycle: Secondary | ICD-10-CM

## 2024-03-26 DIAGNOSIS — E611 Iron deficiency: Secondary | ICD-10-CM

## 2024-03-26 DIAGNOSIS — R790 Abnormal level of blood mineral: Secondary | ICD-10-CM

## 2024-03-26 NOTE — Telephone Encounter (Signed)
 Patient left message on triage line requesting call back, no information provided.   See 02/07/24 labs and recommendations.   Call returned to patient, Left message to call [facility] Triage at [phone], option 4.

## 2024-04-03 MED ORDER — ACCRUFER 30 MG PO CAPS: 30.0000 mg | ORAL_CAPSULE | Freq: Two times a day (BID) | ORAL | 0 refills | Status: DC

## 2024-04-03 NOTE — Telephone Encounter (Signed)
 Spoke with patient.   Lab appt scheduled for 07/17/24 at 1430, orders placed.   Accrufer  order sent to [facility] as prescribed. Patient has since been changed to Junel  Fe -any concerns with additional iron?   [facility] contact sent via MyChart message.   Routing to Jami

## 2024-04-03 NOTE — Telephone Encounter (Signed)
 Patient notified. Encounter closed

## 2024-04-03 NOTE — Telephone Encounter (Signed)
 No, different types of iron.

## 2024-05-02 ENCOUNTER — Other Ambulatory Visit: Payer: Self-pay

## 2024-05-02 MED ORDER — ACCRUFER 30 MG PO CAPS: 30.0000 mg | ORAL_CAPSULE | Freq: Two times a day (BID) | ORAL | 0 refills | Status: DC

## 2024-05-02 NOTE — Telephone Encounter (Signed)
 Med refill request: Accufer  Last AEX: 02/07/24 Next AEX:  not scheduled  Last MMG (if hormonal med) Refill authorized: last rx 04/03/24 #90 with 0 refills. Please approve or deny

## 2024-05-29 ENCOUNTER — Other Ambulatory Visit: Payer: Self-pay

## 2024-05-29 NOTE — Telephone Encounter (Signed)
 Needs labs to recheck iron levels. Jami put future orders in.

## 2024-05-29 NOTE — Telephone Encounter (Signed)
 Med refill request: Accufer  Last AEX: 02/07/24 Next AEX:  not scheduled  Last MMG (if hormonal med) Refill authorized: last rx 05/02/24 #90 with 0 refills. Please approve or deny.  Pharmacy requesting new RX be sent in

## 2024-06-02 NOTE — Telephone Encounter (Signed)
 Lab appt 07/17/24 Rx refused for now. Routing to provider for review

## 2024-06-03 ENCOUNTER — Other Ambulatory Visit: Payer: Self-pay | Admitting: Nurse Practitioner

## 2024-06-03 DIAGNOSIS — D509 Iron deficiency anemia, unspecified: Secondary | ICD-10-CM

## 2024-06-03 MED ORDER — ACCRUFER 30 MG PO CAPS
30.0000 mg | ORAL_CAPSULE | Freq: Two times a day (BID) | ORAL | 0 refills | Status: AC
Start: 2024-06-03 — End: ?

## 2024-06-25 ENCOUNTER — Other Ambulatory Visit: Payer: Self-pay

## 2024-06-25 DIAGNOSIS — D509 Iron deficiency anemia, unspecified: Secondary | ICD-10-CM

## 2024-06-25 NOTE — Telephone Encounter (Signed)
 Med refill request: Accrufer  30 mg Last AEX: 02/07/24 Next AEX: none scheduled Labs scheduled: 07/17/24 Last MMG (if hormonal med) n/a DX: iron deficiency anemia Refill sent to provider for approval.

## 2024-06-26 NOTE — Telephone Encounter (Signed)
 Office has been trying to contact her since August via phone and letters to schedule follow up lab appt. Med refill denied. Needs appt.

## 2024-07-01 NOTE — Telephone Encounter (Signed)
 Pt has been scheduled for labs on 07/17/24.

## 2024-07-17 ENCOUNTER — Other Ambulatory Visit: Payer: Self-pay | Admitting: *Deleted

## 2024-07-17 ENCOUNTER — Other Ambulatory Visit

## 2024-07-17 DIAGNOSIS — E611 Iron deficiency: Secondary | ICD-10-CM

## 2024-07-17 DIAGNOSIS — R790 Abnormal level of blood mineral: Secondary | ICD-10-CM

## 2024-07-17 DIAGNOSIS — N920 Excessive and frequent menstruation with regular cycle: Secondary | ICD-10-CM

## 2024-07-17 LAB — CBC
HCT: 37.9 % (ref 35.9–46.0)
Hemoglobin: 11.7 g/dL (ref 11.7–15.5)
MCH: 23.9 pg — ABNORMAL LOW (ref 27.0–33.0)
MCHC: 30.9 g/dL — ABNORMAL LOW (ref 31.6–35.4)
MCV: 77.5 fL — ABNORMAL LOW (ref 81.4–101.7)
MPV: 12.1 fL (ref 7.5–12.5)
Platelets: 281 Thousand/uL (ref 140–400)
RBC: 4.89 Million/uL (ref 3.80–5.10)
RDW: 14.4 % (ref 11.0–15.0)
WBC: 7.1 Thousand/uL (ref 3.8–10.8)

## 2024-07-18 ENCOUNTER — Ambulatory Visit: Payer: Self-pay | Admitting: Radiology

## 2024-07-18 LAB — IRON,TIBC AND FERRITIN PANEL
%SAT: 21 % (ref 16–45)
Ferritin: 10 ng/mL — ABNORMAL LOW (ref 16–232)
Iron: 95 ug/dL (ref 45–160)
TIBC: 461 ug/dL — ABNORMAL HIGH (ref 250–450)
